# Patient Record
Sex: Female | Born: 1955 | Race: Black or African American | Hispanic: No | Smoking: Current every day smoker
Health system: Southern US, Community
[De-identification: ages and names within clinical notes are randomized; demographics above are authoritative.]

## PROBLEM LIST (undated history)

## (undated) DIAGNOSIS — R112 Nausea with vomiting, unspecified: Secondary | ICD-10-CM

## (undated) DIAGNOSIS — F17210 Nicotine dependence, cigarettes, uncomplicated: Secondary | ICD-10-CM

## (undated) DIAGNOSIS — T4145XA Adverse effect of unspecified anesthetic, initial encounter: Secondary | ICD-10-CM

## (undated) DIAGNOSIS — I209 Angina pectoris, unspecified: Secondary | ICD-10-CM

## (undated) DIAGNOSIS — R7303 Prediabetes: Secondary | ICD-10-CM

## (undated) DIAGNOSIS — K219 Gastro-esophageal reflux disease without esophagitis: Secondary | ICD-10-CM

## (undated) DIAGNOSIS — Z9889 Other specified postprocedural states: Secondary | ICD-10-CM

## (undated) DIAGNOSIS — F32A Depression, unspecified: Secondary | ICD-10-CM

## (undated) DIAGNOSIS — F419 Anxiety disorder, unspecified: Secondary | ICD-10-CM

## (undated) DIAGNOSIS — I7090 Unspecified atherosclerosis: Secondary | ICD-10-CM

## (undated) DIAGNOSIS — I739 Peripheral vascular disease, unspecified: Secondary | ICD-10-CM

## (undated) DIAGNOSIS — F329 Major depressive disorder, single episode, unspecified: Secondary | ICD-10-CM

## (undated) DIAGNOSIS — T8859XA Other complications of anesthesia, initial encounter: Secondary | ICD-10-CM

## (undated) DIAGNOSIS — M1990 Unspecified osteoarthritis, unspecified site: Secondary | ICD-10-CM

## (undated) DIAGNOSIS — D126 Benign neoplasm of colon, unspecified: Secondary | ICD-10-CM

## (undated) HISTORY — DX: Peripheral vascular disease, unspecified: I73.9

## (undated) HISTORY — DX: Unspecified osteoarthritis, unspecified site: M19.90

## (undated) HISTORY — DX: Nicotine dependence, cigarettes, uncomplicated: F17.210

## (undated) HISTORY — DX: Unspecified atherosclerosis: I70.90

## (undated) HISTORY — DX: Benign neoplasm of colon, unspecified: D12.6

## (undated) HISTORY — DX: Anxiety disorder, unspecified: F41.9

## (undated) HISTORY — DX: Prediabetes: R73.03

## (undated) HISTORY — DX: Angina pectoris, unspecified: I20.9

## (undated) HISTORY — PX: ABDOMINAL HYSTERECTOMY: SHX81

## (undated) HISTORY — PX: OTHER SURGICAL HISTORY: SHX169

---

## 1999-03-08 ENCOUNTER — Emergency Department (HOSPITAL_COMMUNITY): Admission: EM | Admit: 1999-03-08 | Discharge: 1999-03-08 | Payer: Self-pay | Admitting: Emergency Medicine

## 1999-04-17 ENCOUNTER — Other Ambulatory Visit: Admission: RE | Admit: 1999-04-17 | Discharge: 1999-04-17 | Payer: Self-pay

## 1999-04-17 ENCOUNTER — Encounter: Admission: RE | Admit: 1999-04-17 | Discharge: 1999-04-17 | Payer: Self-pay | Admitting: Internal Medicine

## 1999-06-15 ENCOUNTER — Encounter: Admission: RE | Admit: 1999-06-15 | Discharge: 1999-06-15 | Payer: Self-pay | Admitting: Internal Medicine

## 1999-06-29 DIAGNOSIS — D259 Leiomyoma of uterus, unspecified: Secondary | ICD-10-CM

## 1999-06-30 ENCOUNTER — Encounter: Payer: Self-pay | Admitting: *Deleted

## 1999-06-30 ENCOUNTER — Ambulatory Visit (HOSPITAL_COMMUNITY): Admission: RE | Admit: 1999-06-30 | Discharge: 1999-06-30 | Payer: Self-pay | Admitting: *Deleted

## 1999-07-13 ENCOUNTER — Encounter: Payer: Self-pay | Admitting: *Deleted

## 1999-07-13 ENCOUNTER — Ambulatory Visit (HOSPITAL_COMMUNITY): Admission: RE | Admit: 1999-07-13 | Discharge: 1999-07-13 | Payer: Self-pay | Admitting: *Deleted

## 1999-08-06 ENCOUNTER — Encounter: Admission: RE | Admit: 1999-08-06 | Discharge: 1999-08-06 | Payer: Self-pay | Admitting: Internal Medicine

## 1999-10-01 ENCOUNTER — Other Ambulatory Visit: Admission: RE | Admit: 1999-10-01 | Discharge: 1999-10-01 | Payer: Self-pay | Admitting: Obstetrics

## 1999-12-10 ENCOUNTER — Encounter (INDEPENDENT_AMBULATORY_CARE_PROVIDER_SITE_OTHER): Payer: Self-pay

## 1999-12-10 ENCOUNTER — Inpatient Hospital Stay (HOSPITAL_COMMUNITY): Admission: RE | Admit: 1999-12-10 | Discharge: 1999-12-13 | Payer: Self-pay | Admitting: Obstetrics

## 2006-03-11 HISTORY — PX: ABDOMINAL HYSTERECTOMY: SHX81

## 2006-04-21 ENCOUNTER — Ambulatory Visit (HOSPITAL_COMMUNITY): Admission: RE | Admit: 2006-04-21 | Discharge: 2006-04-21 | Payer: Self-pay | Admitting: Obstetrics

## 2007-06-29 DIAGNOSIS — C519 Malignant neoplasm of vulva, unspecified: Secondary | ICD-10-CM

## 2007-06-29 HISTORY — DX: Malignant neoplasm of vulva, unspecified: C51.9

## 2007-07-14 DIAGNOSIS — IMO0002 Reserved for concepts with insufficient information to code with codable children: Secondary | ICD-10-CM

## 2007-10-03 DIAGNOSIS — C4490 Unspecified malignant neoplasm of skin, unspecified: Secondary | ICD-10-CM | POA: Insufficient documentation

## 2007-10-18 ENCOUNTER — Encounter (INDEPENDENT_AMBULATORY_CARE_PROVIDER_SITE_OTHER): Payer: Self-pay | Admitting: Internal Medicine

## 2007-10-20 DIAGNOSIS — B070 Plantar wart: Secondary | ICD-10-CM | POA: Insufficient documentation

## 2007-10-31 ENCOUNTER — Ambulatory Visit: Admission: RE | Admit: 2007-10-31 | Discharge: 2007-10-31 | Payer: Self-pay | Admitting: Gynecologic Oncology

## 2007-11-02 ENCOUNTER — Encounter (INDEPENDENT_AMBULATORY_CARE_PROVIDER_SITE_OTHER): Payer: Self-pay | Admitting: Internal Medicine

## 2007-11-07 DIAGNOSIS — R0989 Other specified symptoms and signs involving the circulatory and respiratory systems: Secondary | ICD-10-CM | POA: Insufficient documentation

## 2007-11-07 DIAGNOSIS — R079 Chest pain, unspecified: Secondary | ICD-10-CM | POA: Insufficient documentation

## 2007-11-07 DIAGNOSIS — F17200 Nicotine dependence, unspecified, uncomplicated: Secondary | ICD-10-CM | POA: Insufficient documentation

## 2007-11-07 DIAGNOSIS — R0609 Other forms of dyspnea: Secondary | ICD-10-CM | POA: Insufficient documentation

## 2007-11-10 ENCOUNTER — Telehealth (INDEPENDENT_AMBULATORY_CARE_PROVIDER_SITE_OTHER): Payer: Self-pay | Admitting: *Deleted

## 2007-11-14 ENCOUNTER — Ambulatory Visit: Payer: Self-pay | Admitting: Internal Medicine

## 2007-11-14 DIAGNOSIS — E049 Nontoxic goiter, unspecified: Secondary | ICD-10-CM | POA: Insufficient documentation

## 2007-11-15 ENCOUNTER — Encounter (INDEPENDENT_AMBULATORY_CARE_PROVIDER_SITE_OTHER): Payer: Self-pay | Admitting: Internal Medicine

## 2007-11-16 ENCOUNTER — Encounter (INDEPENDENT_AMBULATORY_CARE_PROVIDER_SITE_OTHER): Payer: Self-pay | Admitting: Internal Medicine

## 2007-11-22 ENCOUNTER — Ambulatory Visit (HOSPITAL_COMMUNITY): Admission: RE | Admit: 2007-11-22 | Discharge: 2007-11-22 | Payer: Self-pay | Admitting: Internal Medicine

## 2007-11-23 ENCOUNTER — Ambulatory Visit: Payer: Self-pay | Admitting: Internal Medicine

## 2007-11-23 ENCOUNTER — Telehealth (INDEPENDENT_AMBULATORY_CARE_PROVIDER_SITE_OTHER): Payer: Self-pay | Admitting: Internal Medicine

## 2007-11-23 LAB — CONVERTED CEMR LAB
AST: 15 units/L (ref 0–37)
Albumin: 4.4 g/dL (ref 3.5–5.2)
Alkaline Phosphatase: 76 units/L (ref 39–117)
BUN: 11 mg/dL (ref 6–23)
Basophils Relative: 0 % (ref 0–1)
Creatinine, Ser: 0.65 mg/dL (ref 0.40–1.20)
Eosinophils Absolute: 0.1 10*3/uL (ref 0.0–0.7)
Eosinophils Relative: 1 % (ref 0–5)
Glucose, Bld: 84 mg/dL (ref 70–99)
HCT: 42.6 % (ref 36.0–46.0)
HDL: 51 mg/dL (ref >39)
Hemoglobin: 13.6 g/dL (ref 12.0–15.0)
LDL Cholesterol: 136 mg/dL — ABNORMAL HIGH (ref 0–99)
Lymphs Abs: 3.3 10*3/uL (ref 0.7–4.0)
MCHC: 31.9 g/dL (ref 30.0–36.0)
MCV: 101.2 fL — ABNORMAL HIGH (ref 78.0–100.0)
Monocytes Absolute: 0.8 10*3/uL (ref 0.1–1.0)
Monocytes Relative: 10 % (ref 3–12)
Neutrophils Relative %: 47 % (ref 43–77)
Nitrite: NEGATIVE
Potassium: 4 meq/L (ref 3.5–5.3)
RBC: 4.21 M/uL (ref 3.87–5.11)
Total CHOL/HDL Ratio: 4.4
Triglycerides: 174 mg/dL — ABNORMAL HIGH (ref <150)
Urobilinogen, UA: 0.2
WBC: 7.9 10*3/uL (ref 4.0–10.5)
pH: 5

## 2007-11-27 ENCOUNTER — Ambulatory Visit: Payer: Self-pay | Admitting: Cardiovascular Disease

## 2007-11-27 ENCOUNTER — Encounter (INDEPENDENT_AMBULATORY_CARE_PROVIDER_SITE_OTHER): Payer: Self-pay | Admitting: *Deleted

## 2007-12-07 ENCOUNTER — Ambulatory Visit: Payer: Self-pay

## 2007-12-07 ENCOUNTER — Encounter (INDEPENDENT_AMBULATORY_CARE_PROVIDER_SITE_OTHER): Payer: Self-pay | Admitting: Internal Medicine

## 2007-12-20 ENCOUNTER — Telehealth (INDEPENDENT_AMBULATORY_CARE_PROVIDER_SITE_OTHER): Payer: Self-pay | Admitting: Internal Medicine

## 2007-12-27 HISTORY — PX: RADICAL VULVECTOMY: SHX2286

## 2008-01-02 ENCOUNTER — Encounter: Payer: Self-pay | Admitting: Gynecologic Oncology

## 2008-01-02 ENCOUNTER — Encounter (INDEPENDENT_AMBULATORY_CARE_PROVIDER_SITE_OTHER): Payer: Self-pay | Admitting: Internal Medicine

## 2008-01-02 ENCOUNTER — Ambulatory Visit (HOSPITAL_COMMUNITY): Admission: RE | Admit: 2008-01-02 | Discharge: 2008-01-03 | Payer: Self-pay | Admitting: Gynecologic Oncology

## 2008-01-16 ENCOUNTER — Ambulatory Visit: Admission: RE | Admit: 2008-01-16 | Discharge: 2008-01-16 | Payer: Self-pay | Admitting: Gynecologic Oncology

## 2008-01-30 ENCOUNTER — Ambulatory Visit: Admission: RE | Admit: 2008-01-30 | Discharge: 2008-01-30 | Payer: Self-pay | Admitting: Gynecologic Oncology

## 2008-01-30 ENCOUNTER — Encounter (INDEPENDENT_AMBULATORY_CARE_PROVIDER_SITE_OTHER): Payer: Self-pay | Admitting: Internal Medicine

## 2008-02-13 ENCOUNTER — Ambulatory Visit: Admission: RE | Admit: 2008-02-13 | Discharge: 2008-02-13 | Payer: Self-pay | Admitting: Gynecologic Oncology

## 2008-02-13 ENCOUNTER — Encounter (INDEPENDENT_AMBULATORY_CARE_PROVIDER_SITE_OTHER): Payer: Self-pay | Admitting: Internal Medicine

## 2008-03-11 ENCOUNTER — Encounter (INDEPENDENT_AMBULATORY_CARE_PROVIDER_SITE_OTHER): Payer: Self-pay | Admitting: Internal Medicine

## 2008-03-12 ENCOUNTER — Ambulatory Visit: Payer: Self-pay | Admitting: Internal Medicine

## 2008-03-12 DIAGNOSIS — M25579 Pain in unspecified ankle and joints of unspecified foot: Secondary | ICD-10-CM

## 2008-03-12 DIAGNOSIS — M25569 Pain in unspecified knee: Secondary | ICD-10-CM

## 2008-03-18 ENCOUNTER — Encounter (INDEPENDENT_AMBULATORY_CARE_PROVIDER_SITE_OTHER): Payer: Self-pay | Admitting: Internal Medicine

## 2008-03-19 ENCOUNTER — Ambulatory Visit: Admission: RE | Admit: 2008-03-19 | Discharge: 2008-03-19 | Payer: Self-pay | Admitting: Gynecologic Oncology

## 2008-03-19 ENCOUNTER — Encounter (INDEPENDENT_AMBULATORY_CARE_PROVIDER_SITE_OTHER): Payer: Self-pay | Admitting: Internal Medicine

## 2008-03-20 ENCOUNTER — Encounter (INDEPENDENT_AMBULATORY_CARE_PROVIDER_SITE_OTHER): Payer: Self-pay | Admitting: Internal Medicine

## 2008-03-21 ENCOUNTER — Encounter (INDEPENDENT_AMBULATORY_CARE_PROVIDER_SITE_OTHER): Payer: Self-pay | Admitting: Internal Medicine

## 2008-04-11 ENCOUNTER — Encounter (INDEPENDENT_AMBULATORY_CARE_PROVIDER_SITE_OTHER): Payer: Self-pay | Admitting: Internal Medicine

## 2008-06-12 ENCOUNTER — Ambulatory Visit: Admission: RE | Admit: 2008-06-12 | Discharge: 2008-06-12 | Payer: Self-pay | Admitting: Gynecologic Oncology

## 2008-06-12 ENCOUNTER — Encounter (INDEPENDENT_AMBULATORY_CARE_PROVIDER_SITE_OTHER): Payer: Self-pay | Admitting: Internal Medicine

## 2008-08-13 ENCOUNTER — Ambulatory Visit: Payer: Self-pay | Admitting: Internal Medicine

## 2008-08-13 DIAGNOSIS — M76899 Other specified enthesopathies of unspecified lower limb, excluding foot: Secondary | ICD-10-CM

## 2008-08-15 ENCOUNTER — Encounter (INDEPENDENT_AMBULATORY_CARE_PROVIDER_SITE_OTHER): Payer: Self-pay | Admitting: Internal Medicine

## 2008-08-16 ENCOUNTER — Encounter (INDEPENDENT_AMBULATORY_CARE_PROVIDER_SITE_OTHER): Payer: Self-pay | Admitting: Internal Medicine

## 2008-08-16 DIAGNOSIS — M79609 Pain in unspecified limb: Secondary | ICD-10-CM

## 2008-08-21 ENCOUNTER — Encounter (INDEPENDENT_AMBULATORY_CARE_PROVIDER_SITE_OTHER): Payer: Self-pay | Admitting: Internal Medicine

## 2008-09-25 ENCOUNTER — Encounter (INDEPENDENT_AMBULATORY_CARE_PROVIDER_SITE_OTHER): Payer: Self-pay | Admitting: Internal Medicine

## 2008-09-25 ENCOUNTER — Ambulatory Visit: Admission: RE | Admit: 2008-09-25 | Discharge: 2008-09-25 | Payer: Self-pay | Admitting: Gynecologic Oncology

## 2008-09-25 ENCOUNTER — Encounter (INDEPENDENT_AMBULATORY_CARE_PROVIDER_SITE_OTHER): Payer: Self-pay | Admitting: Gynecologic Oncology

## 2008-10-09 ENCOUNTER — Telehealth (INDEPENDENT_AMBULATORY_CARE_PROVIDER_SITE_OTHER): Payer: Self-pay | Admitting: Internal Medicine

## 2008-10-14 ENCOUNTER — Encounter (INDEPENDENT_AMBULATORY_CARE_PROVIDER_SITE_OTHER): Payer: Self-pay | Admitting: Internal Medicine

## 2008-10-16 ENCOUNTER — Encounter (INDEPENDENT_AMBULATORY_CARE_PROVIDER_SITE_OTHER): Payer: Self-pay | Admitting: Internal Medicine

## 2008-10-22 ENCOUNTER — Encounter: Payer: Self-pay | Admitting: Gynecologic Oncology

## 2008-10-22 ENCOUNTER — Ambulatory Visit (HOSPITAL_COMMUNITY): Admission: RE | Admit: 2008-10-22 | Discharge: 2008-10-22 | Payer: Self-pay | Admitting: Gynecologic Oncology

## 2008-10-24 ENCOUNTER — Ambulatory Visit: Payer: Self-pay | Admitting: Internal Medicine

## 2008-11-13 ENCOUNTER — Encounter: Payer: Self-pay | Admitting: Cardiovascular Disease

## 2008-11-13 ENCOUNTER — Ambulatory Visit: Admission: RE | Admit: 2008-11-13 | Discharge: 2008-11-13 | Payer: Self-pay | Admitting: Gynecologic Oncology

## 2008-12-03 ENCOUNTER — Encounter: Payer: Self-pay | Admitting: Gynecologic Oncology

## 2008-12-03 ENCOUNTER — Ambulatory Visit (HOSPITAL_COMMUNITY): Admission: RE | Admit: 2008-12-03 | Discharge: 2008-12-04 | Payer: Self-pay | Admitting: Gynecologic Oncology

## 2008-12-05 ENCOUNTER — Telehealth (INDEPENDENT_AMBULATORY_CARE_PROVIDER_SITE_OTHER): Payer: Self-pay | Admitting: Internal Medicine

## 2008-12-17 ENCOUNTER — Encounter: Payer: Self-pay | Admitting: Cardiovascular Disease

## 2008-12-17 ENCOUNTER — Ambulatory Visit: Admission: RE | Admit: 2008-12-17 | Discharge: 2008-12-17 | Payer: Self-pay | Admitting: Gynecologic Oncology

## 2008-12-25 ENCOUNTER — Encounter (INDEPENDENT_AMBULATORY_CARE_PROVIDER_SITE_OTHER): Payer: Self-pay | Admitting: *Deleted

## 2009-01-08 ENCOUNTER — Encounter: Payer: Self-pay | Admitting: Cardiovascular Disease

## 2009-01-08 ENCOUNTER — Ambulatory Visit: Admission: RE | Admit: 2009-01-08 | Discharge: 2009-01-08 | Payer: Self-pay | Admitting: Gynecologic Oncology

## 2009-04-02 ENCOUNTER — Ambulatory Visit: Admission: RE | Admit: 2009-04-02 | Discharge: 2009-04-02 | Payer: Self-pay | Admitting: Gynecology

## 2009-04-02 ENCOUNTER — Encounter: Payer: Self-pay | Admitting: Cardiovascular Disease

## 2009-05-09 ENCOUNTER — Ambulatory Visit: Payer: Self-pay | Admitting: Internal Medicine

## 2009-05-12 ENCOUNTER — Telehealth (INDEPENDENT_AMBULATORY_CARE_PROVIDER_SITE_OTHER): Payer: Self-pay | Admitting: Internal Medicine

## 2009-05-16 ENCOUNTER — Emergency Department (HOSPITAL_COMMUNITY): Admission: EM | Admit: 2009-05-16 | Discharge: 2009-05-16 | Payer: Self-pay | Admitting: Family Medicine

## 2009-05-28 ENCOUNTER — Ambulatory Visit: Payer: Self-pay | Admitting: Internal Medicine

## 2009-05-28 DIAGNOSIS — M542 Cervicalgia: Secondary | ICD-10-CM

## 2009-05-28 DIAGNOSIS — M25519 Pain in unspecified shoulder: Secondary | ICD-10-CM

## 2009-05-28 LAB — CONVERTED CEMR LAB
BUN: 11 mg/dL (ref 6–23)
CO2: 22 meq/L (ref 19–32)
Glucose, Bld: 82 mg/dL (ref 70–99)
Potassium: 4.1 meq/L (ref 3.5–5.3)
Sodium: 142 meq/L (ref 135–145)

## 2009-06-06 ENCOUNTER — Encounter (INDEPENDENT_AMBULATORY_CARE_PROVIDER_SITE_OTHER): Payer: Self-pay | Admitting: Internal Medicine

## 2009-06-06 ENCOUNTER — Ambulatory Visit (HOSPITAL_COMMUNITY): Admission: RE | Admit: 2009-06-06 | Discharge: 2009-06-06 | Payer: Self-pay | Admitting: Internal Medicine

## 2009-06-16 ENCOUNTER — Encounter (INDEPENDENT_AMBULATORY_CARE_PROVIDER_SITE_OTHER): Payer: Self-pay | Admitting: Internal Medicine

## 2009-07-01 ENCOUNTER — Telehealth (INDEPENDENT_AMBULATORY_CARE_PROVIDER_SITE_OTHER): Payer: Self-pay | Admitting: Internal Medicine

## 2009-07-14 ENCOUNTER — Encounter: Admission: RE | Admit: 2009-07-14 | Discharge: 2009-08-13 | Payer: Self-pay | Admitting: Internal Medicine

## 2009-07-21 ENCOUNTER — Encounter (INDEPENDENT_AMBULATORY_CARE_PROVIDER_SITE_OTHER): Payer: Self-pay | Admitting: Internal Medicine

## 2009-09-12 ENCOUNTER — Ambulatory Visit: Payer: Self-pay | Admitting: Internal Medicine

## 2009-09-12 DIAGNOSIS — M5412 Radiculopathy, cervical region: Secondary | ICD-10-CM | POA: Insufficient documentation

## 2009-09-16 ENCOUNTER — Encounter (INDEPENDENT_AMBULATORY_CARE_PROVIDER_SITE_OTHER): Payer: Self-pay | Admitting: Internal Medicine

## 2009-09-17 ENCOUNTER — Encounter: Admission: RE | Admit: 2009-09-17 | Discharge: 2009-09-17 | Payer: Self-pay | Admitting: Internal Medicine

## 2010-02-23 ENCOUNTER — Ambulatory Visit: Payer: Self-pay | Admitting: Internal Medicine

## 2010-02-23 DIAGNOSIS — M545 Low back pain, unspecified: Secondary | ICD-10-CM | POA: Insufficient documentation

## 2010-02-23 DIAGNOSIS — N3946 Mixed incontinence: Secondary | ICD-10-CM | POA: Insufficient documentation

## 2010-02-24 ENCOUNTER — Encounter (INDEPENDENT_AMBULATORY_CARE_PROVIDER_SITE_OTHER): Payer: Self-pay | Admitting: Internal Medicine

## 2010-02-25 ENCOUNTER — Telehealth (INDEPENDENT_AMBULATORY_CARE_PROVIDER_SITE_OTHER): Payer: Self-pay | Admitting: *Deleted

## 2010-03-06 ENCOUNTER — Encounter (INDEPENDENT_AMBULATORY_CARE_PROVIDER_SITE_OTHER): Payer: Self-pay | Admitting: Internal Medicine

## 2010-03-06 DIAGNOSIS — E7800 Pure hypercholesterolemia, unspecified: Secondary | ICD-10-CM | POA: Insufficient documentation

## 2010-03-06 LAB — CONVERTED CEMR LAB
ALT: <8 units/L (ref 0–35)
AST: 13 units/L (ref 0–37)
Alkaline Phosphatase: 75 units/L (ref 39–117)
Basophils Absolute: 0 10*3/uL (ref 0.0–0.1)
Basophils Relative: 0 % (ref 0–1)
Eosinophils Absolute: 0.1 10*3/uL (ref 0.0–0.7)
Eosinophils Relative: 1 % (ref 0–5)
Glucose, Bld: 84 mg/dL (ref 70–99)
HCT: 42.2 % (ref 36.0–46.0)
LDL Cholesterol: 143 mg/dL — ABNORMAL HIGH (ref 0–99)
MCHC: 31.8 g/dL (ref 30.0–36.0)
MCV: 99.8 fL (ref 78.0–100.0)
Neutrophils Relative %: 42 % — ABNORMAL LOW (ref 43–77)
Platelets: 342 10*3/uL (ref 150–400)
RDW: 15.2 % (ref 11.5–15.5)
Sodium: 145 meq/L (ref 135–145)
Total Bilirubin: 0.5 mg/dL (ref 0.3–1.2)
Total Protein: 7.4 g/dL (ref 6.0–8.3)
Triglycerides: 118 mg/dL (ref <150)
VLDL: 24 mg/dL (ref 0–40)

## 2010-03-11 ENCOUNTER — Telehealth (INDEPENDENT_AMBULATORY_CARE_PROVIDER_SITE_OTHER): Payer: Self-pay | Admitting: Internal Medicine

## 2010-03-26 ENCOUNTER — Ambulatory Visit: Admission: RE | Admit: 2010-03-26 | Discharge: 2010-03-26 | Payer: Self-pay | Admitting: Gynecologic Oncology

## 2010-04-24 IMAGING — CR DG CHEST 2V
2 series · 2 of 2 positions shown · non-contrast
Comparison: None

CLINICAL DATA: Chest pain, shortness of breath, smoker

CHEST - 2 VIEW

[w chest pa]
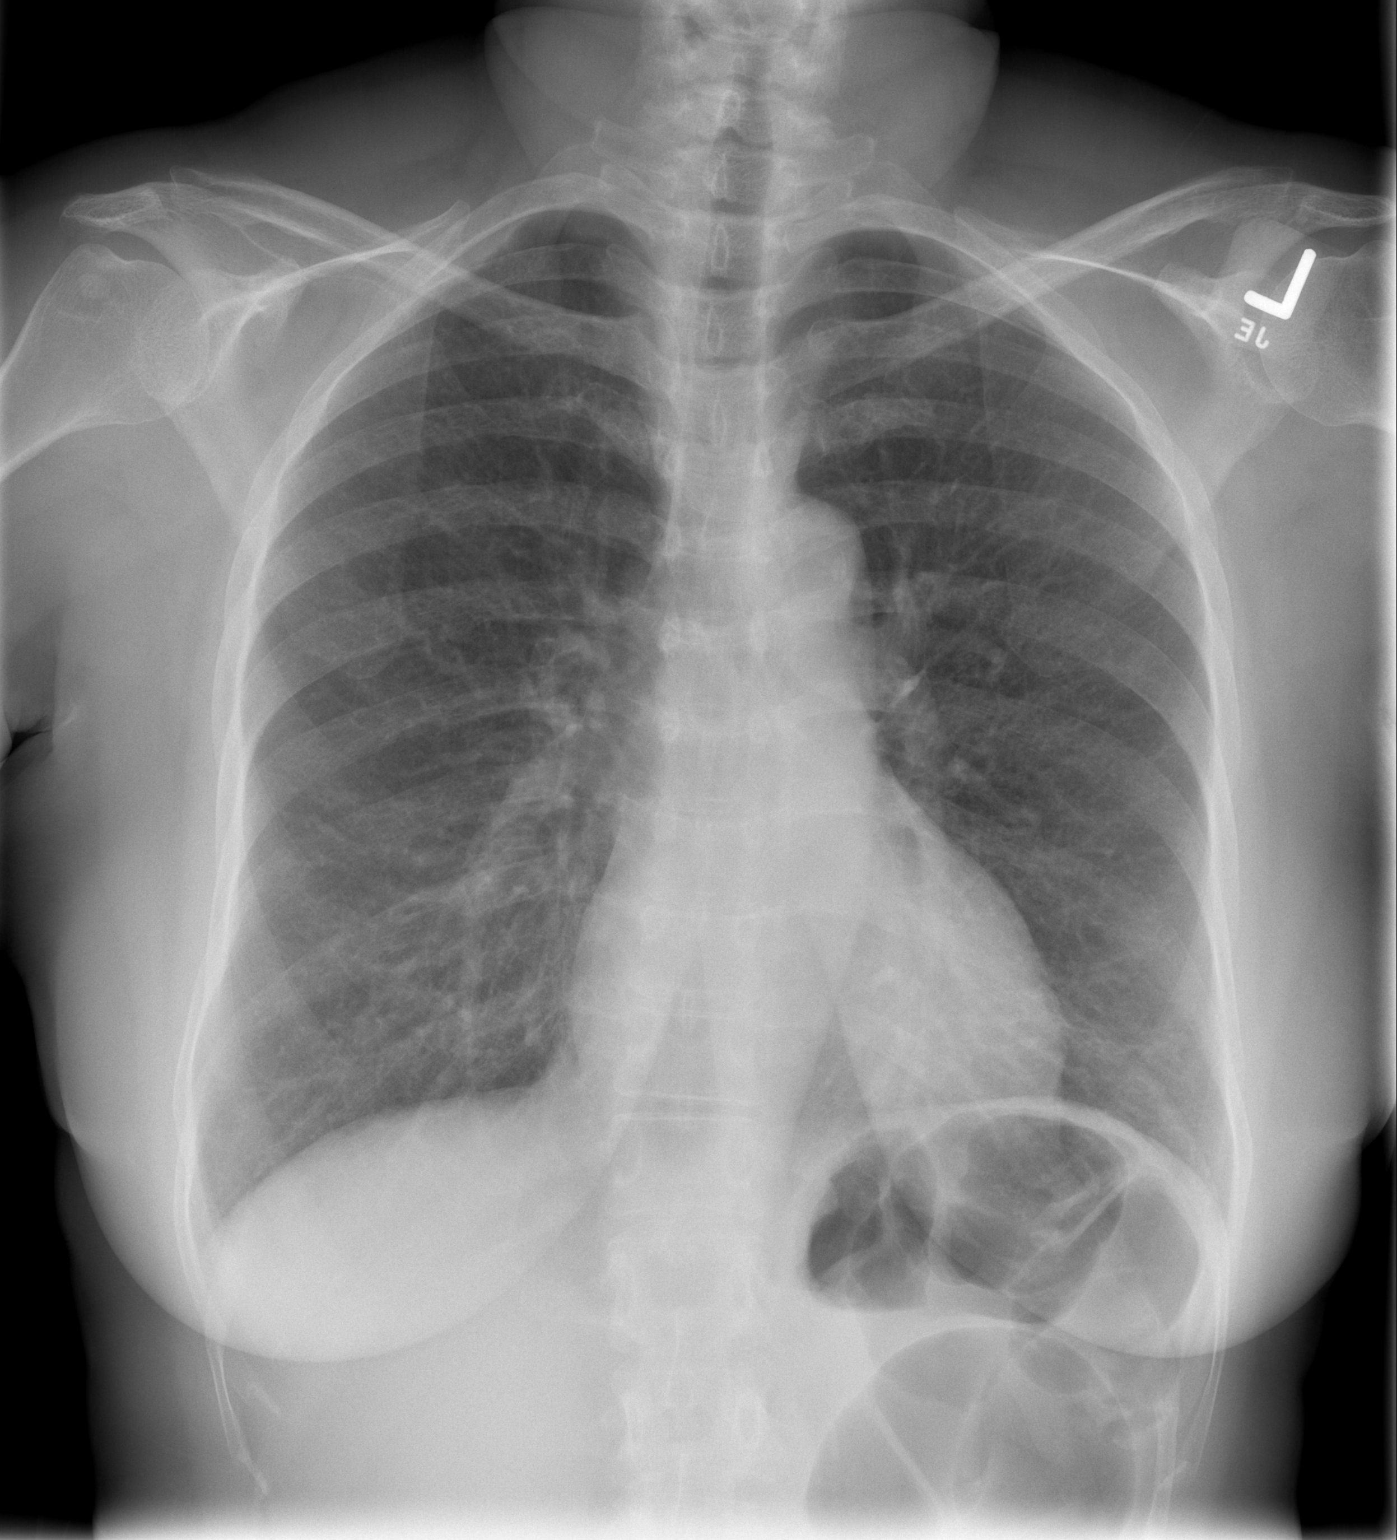

[w chest lat *]
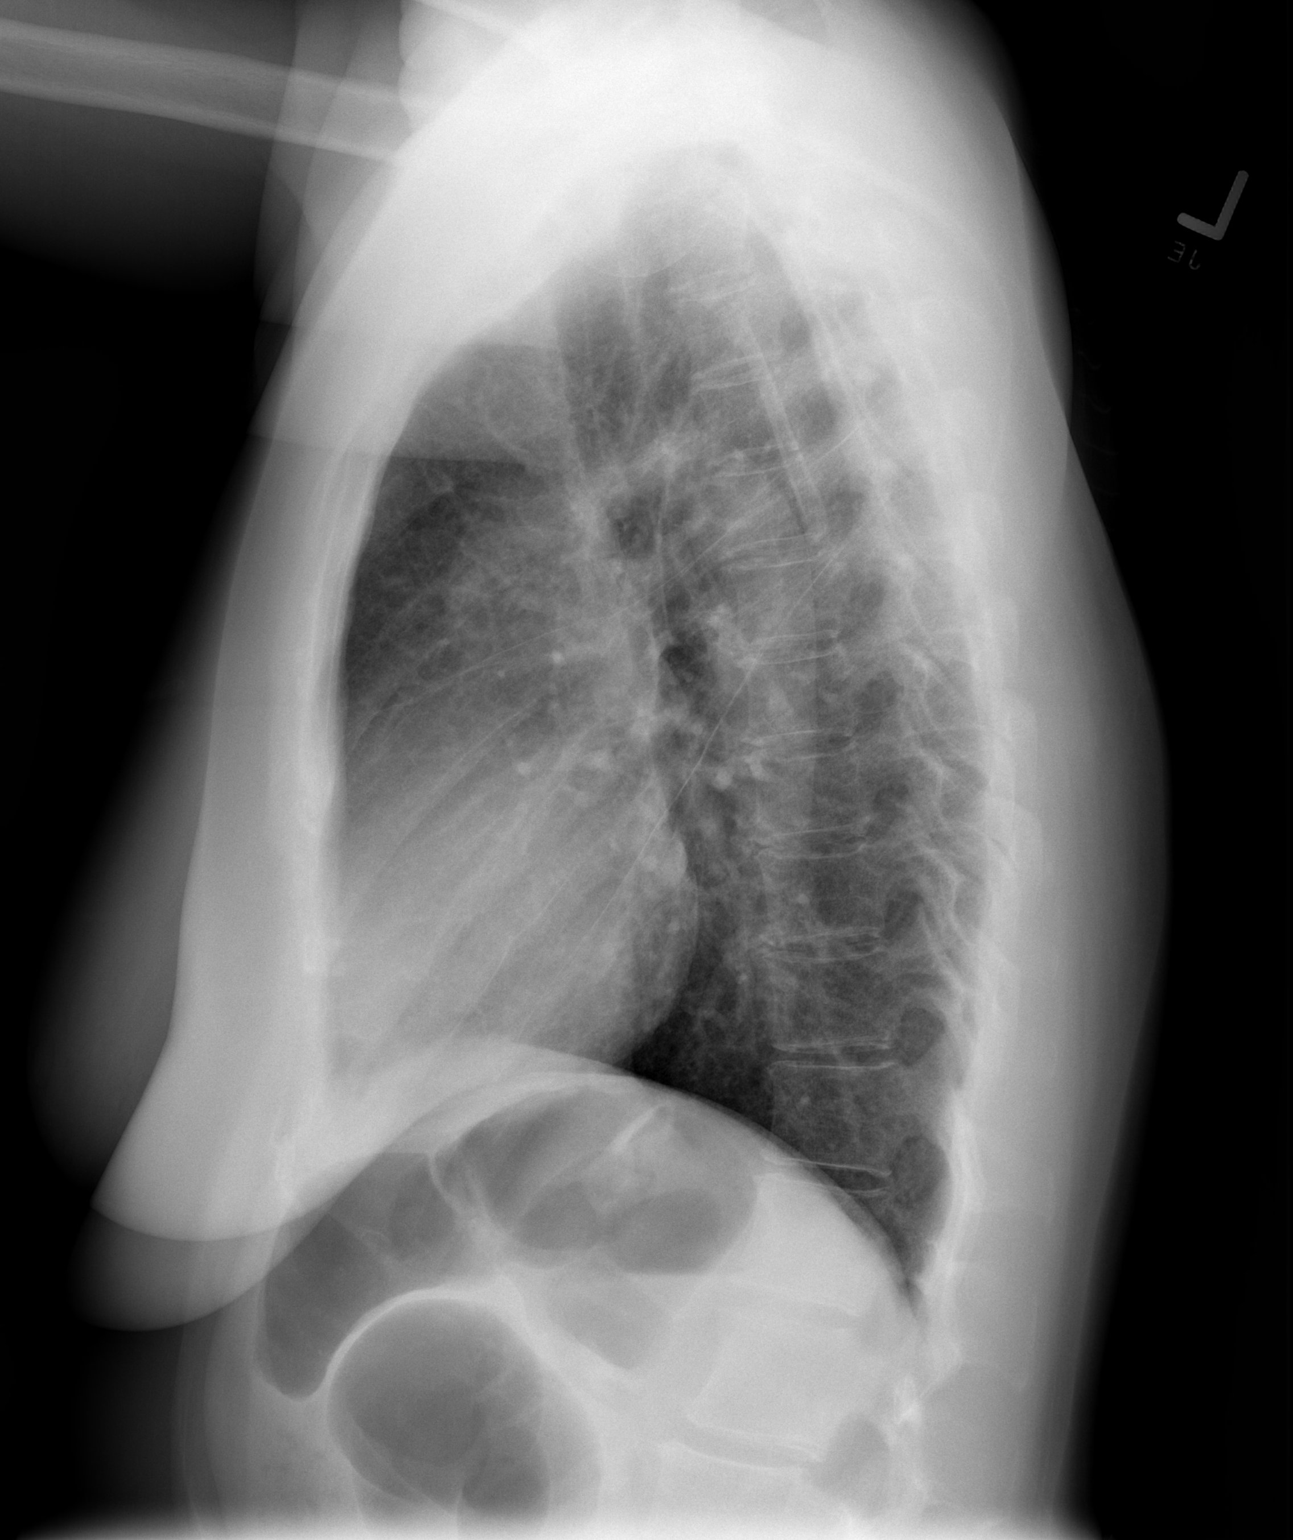

[2 of 2 positions shown; findings below may reference images not displayed]

FINDINGS: Heart size is normal.  Increased interstitial lung
markings are noted without focal pulmonary opacity.  No pleural
effusion.  No acute osseous abnormality.
IMPRESSION: No acute cardiopulmonary process.

## 2010-04-27 ENCOUNTER — Ambulatory Visit: Payer: Self-pay | Admitting: Internal Medicine

## 2010-06-05 ENCOUNTER — Ambulatory Visit: Payer: Self-pay | Admitting: Internal Medicine

## 2010-06-09 ENCOUNTER — Ambulatory Visit (HOSPITAL_COMMUNITY)
Admission: RE | Admit: 2010-06-09 | Discharge: 2010-06-09 | Payer: Self-pay | Source: Home / Self Care | Attending: Internal Medicine | Admitting: Internal Medicine

## 2010-06-16 ENCOUNTER — Encounter (INDEPENDENT_AMBULATORY_CARE_PROVIDER_SITE_OTHER): Payer: Self-pay | Admitting: Internal Medicine

## 2010-07-19 ENCOUNTER — Encounter: Payer: Self-pay | Admitting: Physical Medicine and Rehabilitation

## 2010-07-19 ENCOUNTER — Encounter: Payer: Self-pay | Admitting: Internal Medicine

## 2010-07-20 ENCOUNTER — Encounter: Payer: Self-pay | Admitting: Physical Medicine and Rehabilitation

## 2010-07-27 ENCOUNTER — Encounter
Admission: RE | Admit: 2010-07-27 | Discharge: 2010-07-28 | Payer: Self-pay | Source: Home / Self Care | Attending: Internal Medicine | Admitting: Internal Medicine

## 2010-07-30 NOTE — Letter (Signed)
Summary: *HSN Results Follow up  [facility]  [address] [place], [place] [address]   Phone: [phone]  Fax: [phone]      06/16/2010   [name] [address] [place], [place]  [address]   Dear  Ms. [name],                            ____S.Drinkard,FNP   ____D. Gore,FNP       ____B. McPherson,MD   ____V. Rankins,MD    __X__E. Mulberry,MD    ____N. Daphine Deutscher, FNP  ____D. Reche Dixon, MD    ____K. Philipp Deputy, MD    ____Other     This letter is to inform you that your recent test(s):  _______Pap Smear    _______Lab Test     ___X____X-ray    ____X___ is within acceptable limits  _______ requires a medication change  _______ requires a follow-up lab visit  _______ requires a follow-up visit with your provider   Comments:  Call if your pain does not improve.  No abnormal findings on xray of leg.       _________________________________________________________ If you have any questions, please contact our office                     Sincerely,  Julieanne Manson MD [facility]

## 2010-07-30 NOTE — Letter (Signed)
Summary: TEST ORDER FORM/MRI  TEST ORDER FORM/MRI   Imported By: Arta Bruce 03/12/2010 14:21:19  _____________________________________________________________________  External Attachment:    Type:   Image     Comment:   External Document

## 2010-07-30 NOTE — Progress Notes (Signed)
Summary: REFILL ON PAIN MED  Phone Note Call from Patient Call back at Home Phone [phone]   Summary of Call: MULBERRY PT. MS [place] CALLED BECAUSE SHE WAS TO LET DR MULBERRY KNOW IF SHE NEEDED DICLOFENAC SODIUM 75MG  AND SHE DOES. SHE USES [facility]. Initial call taken by: Leodis Rains,  March 11, 2010 3:15 PM  Follow-up for Phone Call        fwd to provider Follow-up by: Michelle Nasuti,  March 16, 2010 10:11 AM  Additional Follow-up for Phone Call Additional follow up Details #1::        Let her know I sent to [facility] not need to see this back  Additional Follow-up by: Julieanne Manson MD,  March 19, 2010 6:18 PM    Additional Follow-up for Phone Call Additional follow up Details #2::    no answer unable to leave msg Michelle Nasuti  March 20, 2010 8:49 AM  pt aware Michelle Nasuti  March 23, 2010 8:41 AM   Prescriptions: DICLOFENAC SODIUM 75 MG TBEC (DICLOFENAC SODIUM) 1 tab by mouth two times a day with food.  #60 x 2   Entered and Authorized by:   Julieanne Manson MD   Signed by:   Julieanne Manson MD on 03/19/2010   Method used:   Electronically to        [facility] # [phone]* (retail)       [address]       [place], [place]  [address]       Ph: [phone]       Fax: [phone]   RxID:   [id number]   Appended Document: REFILL ON PAIN MED at pts request rx called into [facility] pharmacy

## 2010-07-30 NOTE — Letter (Signed)
Summary: MAILED UPDATED RECORDS TO [facility]  MAILED UPDATED RECORDS TO [facility]   Imported By: Arta Bruce 03/06/2010 11:37:45  _____________________________________________________________________  External Attachment:    Type:   Image     Comment:   External Document

## 2010-07-30 NOTE — Progress Notes (Signed)
Summary: GYN ONCOLOGY APPT  Phone Note Outgoing Call   Summary of Call: I SPOKE TO MS Filter REGARDING HER APPT GYN ONOLOGY  [facility]  03-26-10 @ 1:30PM ARRIVAL @ 1PM  PT IS AWARE OF HER APPT . Initial call taken by: Cheryll Dessert,  February 25, 2010 3:53 PM

## 2010-07-30 NOTE — Assessment & Plan Note (Signed)
Summary: RIGHT ARM PAIN/ GK   Vital Signs:  Patient profile:   55 year old female Weight:      157.9 pounds Temp:     98.3 degrees F Pulse rate:   89 / minute Pulse rhythm:   regular Resp:     18 per minute BP sitting:   160 / 89  (left arm) Cuff size:   regular  Vitals Entered By: Vesta Mixer CMA (September 12, 2009 11:25 AM) CC: right shoulder/arm pain that started yesterday.  Sharp pain then fingers feel a little numb. Is Patient Diabetic? No Pain Assessment Patient in pain? yes     Location: rt arm Intensity: 6  Does patient need assistance? Ambulation Normal   CC:  right shoulder/arm pain that started yesterday.  Sharp pain then fingers feel a little numb.Marland Kitchen  History of Present Illness: 1.  Right neck, shoulder and arm pain continues.  Numbness and tingling shooting into thumb, index, middle and ring fingers.  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  Finally got into PT in January.  Pt. states she went for a couple of weeks, 3 times weekly--pt. states she was discharged at that point--all the time she was given.  Home exercises--doing 3 times weekly.  Pain seems just as bad as before.  Allergies (verified): 1)  Codeine  Physical Exam  Msk:  Right shoulder and neck:  Difficulty abducting and flexing shoulder above 90 degrees secondary to pain.  Almost full passive abduction and flexion.  Pain and decreased ROM with internal rotation and external rotation. Tender over cervical spinous processes, though pt. jumping in pain before actual contact made at times.  Tender along both heads of trap. Tender over AC, CC joints and subacromial bursa area.  Tender over biceps tendon.   Neurologic:  strength normal in upper extremities and DTRs symmetrical and normal.     Impression & Recommendations:  Problem # 1:  CERVICAL RADICULOPATHY, RIGHT (ICD-723.4)  Vs more of a shoulder abnormality Diclofenac--pt never picked up--sent a new Rx  Orders: MRI without  Contrast (MRI w/o Contrast)  Complete Medication List: 1)  Estradiol 0.0375 Mg/24hr Ptwk (Estradiol) .... Apply once a week 2)  Aspirin Ec 81 Mg Tbec (Aspirin) .Marland Kitchen.. 1 tab by mouth daily 3)  Cymbalta 30 Mg Cpep (Duloxetine hcl) .... 2 by mouth at bedtime 4)  Aldara 5 % Crea (Imiquimod) .... Appy three times a week 5)  Diclofenac Sodium 75 Mg Tbec (Diclofenac sodium) .Marland Kitchen.. 1 tab by mouth two times a day with food. 6)  Cyclobenzaprine Hcl 10 Mg Tabs (Cyclobenzaprine hcl) .Marland Kitchen.. 1 tab by mouth every 8 hours as needed for neck and shoulder pain 7)  Ativan 1 Mg Tabs (Lorazepam) .... 1/2 to 1 tab by mouth 1/2 hour before mri--make sure you have a ride  Patient Instructions: 1)  Follow up with Dr. Delrae Alfred in 3 months --neck pain Prescriptions: ATIVAN 1 MG TABS (LORAZEPAM) 1/2 to 1 tab by mouth 1/2 hour before MRI--make sure you have a ride  #1 x 0   Entered and Authorized by:   Julieanne Manson MD   Signed by:   Julieanne Manson MD on 09/12/2009   Method used:   Print then Give to Patient   RxID:   [id number] CYCLOBENZAPRINE HCL 10 MG TABS (CYCLOBENZAPRINE HCL) 1 tab by mouth every 8 hours as needed for neck and shoulder pain  #20 x 0   Entered and Authorized by:   Lanora Manis  Jhony Antrim MD   Signed by:   Julieanne Manson MD on 09/12/2009   Method used:   Print then Give to Patient   RxID:   [id number] CYCLOBENZAPRINE HCL 10 MG TABS (CYCLOBENZAPRINE HCL) 1 tab by mouth every 8 hours as needed for neck and shoulder pain  #20 x 0   Entered and Authorized by:   Julieanne Manson MD   Signed by:   Julieanne Manson MD on 09/12/2009   Method used:   Print then Give to Patient   RxID:   [id number] DICLOFENAC SODIUM 75 MG TBEC (DICLOFENAC SODIUM) 1 tab by mouth two times a day with food.  #60 x 2   Entered and Authorized by:   Julieanne Manson MD   Signed by:   Julieanne Manson MD on 09/12/2009   Method used:   Electronically to        [facility] # [phone]*  (retail)       [address]       [place], [place]  [address]       Ph: [phone]       Fax: [phone]   RxID:   [id number]

## 2010-07-30 NOTE — Progress Notes (Signed)
Summary: DIDNT HEAR FROM REHAB/WANTS TO STOP SMOKING  Phone Note Call from Patient Call back at [facility] Phone [phone]   Summary of Call: [name] PT. [name] CALLED TO LET YOU KNOW THAT SHE DID NOT HEAR FROM THE [facility] FOR AN APPT. AND ALSO SHE WANTS TO STOP SMOKING AND WANTS TO TRY NICOTROL-INHALER. SHE USES [facility] [address]. Initial call taken by: Leodis Rains,  July 01, 2009 12:21 PM  Follow-up for Phone Call        Will refax PT referral and forward note to Dr. Delrae Alfred Follow-up by: Vesta Mixer CMA,  July 01, 2009 12:47 PM  Additional Follow-up for Phone Call Additional follow up Details #1::        Thanks for refaxing--remind pt. to notify if again no response. I do not believe nicotrol inhaler is covered by anybody and actually do not believe it works that well.  What else has she tried before to quit? Additional Follow-up by: Julieanne Manson MD,  July 03, 2009 5:48 PM    Additional Follow-up for Phone Call Additional follow up Details #2::    pt states she has heard from [facility] they have scheduled her an appt and they will go from there, she couldnt remember the appt date and time off hand.  Pt states she has tried the nicotine gum before, and chantix.  She states that when she was on Chantix it gave her a bad feelingmade her very nervous and depressed.  She states while taking the chantix she was still smoking whie on it.[name]   July 09, 2009 4:18 PM  Have her make an appt. to discuss Wellbutrin.  Julieanne Manson MD  July 09, 2009 9:57 PM   Additional Follow-up for Phone Call Additional follow up Details #3:: Details for Additional Follow-up Action Taken: Left message on answering machine for pt to return call ........... [name] CMA  July 14, 2009 11:26 AM  Pt is scheduled for f/u 08/26/09 Additional Follow-up by: Vesta Mixer CMA,  July 21, 2009 12:09 PM

## 2010-07-30 NOTE — Letter (Signed)
Summary: PODIATRY  PODIATRY   Imported By: Arta Bruce 05/13/2010 14:26:29  _____________________________________________________________________  External Attachment:    Type:   Image     Comment:   External Document

## 2010-07-30 NOTE — Assessment & Plan Note (Signed)
Summary: HIP/BACK/SIDE PAIN/HOT FLASHES//KT   Vital Signs:  Patient profile:   55 year old female Height:      65 inches Weight:      150 pounds BMI:     25.05 Temp:     97.9 degrees F oral Pulse rate:   66 / minute Pulse rhythm:   regular Resp:     18 per minute BP sitting:   122 / 75  (left arm)  Vitals Entered By: CMA Student CC: office visit for pain from neck to lower back ( right side), on going issue, day and night sweats,  on going issue Is Patient Diabetic? No Pain Assessment Patient in pain? yes     Location: hip/back/neck/side Intensity: 7 Type: aching  Does patient need assistance? Functional Status Self care Ambulation Normal   CC:  office visit for pain from neck to lower back ( right side), on going issue, day and night sweats, and on going issue.  History of Present Illness: 1.  Right neck, shoulder pain with radiation to middle 3 fingers.  Was supposed to have an MRI of Cspine, but did not tolerate the MRI--did not receive information regarding the problem that I can see in EMR.  Pt. did not notify the office and I do not see that radiology did as well.  Unable to get into echart currently to see if even a truncated report that did not get to [place].  Pt. ran out of Medicaid during the time she was to follow up with me so did not come for appt.  Has [employer] card now.  Pt. apparently became quite diaphoretic and nauseated in MRI and they had to remove her from the MRI after only 20 minutes.  Pain may be a bit worse in neck and shoulder.  Not clear if any more weakness in hand or arm--limited by pain.    2.  Pain in low back bilaterally--has had for some time.  No history of injury.  3.  Vulvar cancer:  pt. not sure if she is to be following up with Dr. Duard Brady or not.  Cannot recall when her last visit was or whether was released or not.  4.  Problems with urinary incontinence:  Has had for some time--using a pad regularly.  Loses urine control with coughing or  sneezing--generally already has urge to urinate.  Also has urgency and cannot hold.  Sometimes with dysuria as well--more on inside, not outside.  Current Medications (verified): 1)  Estradiol 0.0375 Mg/24hr  Ptwk (Estradiol) .... Apply Once A Week 2)  Aspirin Ec 81 Mg  Tbec (Aspirin) .Marland Kitchen.. 1 Tab By Mouth Daily 3)  Cymbalta 30 Mg Cpep (Duloxetine Hcl) .... 2 By Mouth At Bedtime 4)  Aldara 5 % Crea (Imiquimod) .... Appy Three Times A Week 5)  Diclofenac Sodium 75 Mg Tbec (Diclofenac Sodium) .Marland Kitchen.. 1 Tab By Mouth Two Times A Day With Food. 6)  Cyclobenzaprine Hcl 10 Mg Tabs (Cyclobenzaprine Hcl) .Marland Kitchen.. 1 Tab By Mouth Every 8 Hours As Needed For Neck and Shoulder Pain 7)  Ativan 1 Mg Tabs (Lorazepam) .... 1/2 To 1 Tab By Mouth 1/2 Hour Before Mri--Make Sure You Have A Ride  Allergies (verified): 1)  Codeine  Physical Exam  General:  Tearful at times--not sure what she is supposed to be doing with vulvar cancer and is fearful. Lungs:  Normal respiratory effort, chest expands symmetrically. Lungs are clear to auscultation, no crackles or wheezes. Heart:  Normal rate and regular  rhythm. S1 and S2 normal without gallop, murmur, click, rub or other extra sounds.  Radial pulses normal and equal Genitalia:  No obvious signs of inflammation of external vaginal orifice and vulvar area.  No leukoplakia noted Msk:  Very anxious appearing.  Holding shoulders stiffly. Tender over traps bilaterally Neurologic:  Right grip decreased somewhat, but limited by pain.alert & oriented X3, cranial nerves II-XII intact, and DTRs symmetrical and normal.  JUmps as if in pain with testing of reflexes.   Impression & Recommendations:  Problem # 1:  CERVICAL RADICULOPATHY, RIGHT (ICD-723.4) Take Diclofenac Orders: MRI without Contrast (MRI w/o Contrast)  Problem # 2:  URINARY INCONTINENCE, MIXED (ICD-788.33)  Start Detrol  Orders: UA Dipstick w/o Micro (manual) (54098) T-Culture, Urine ([id number])  Problem #  3:  LOW BACK PAIN SYNDROME (ICD-724.2)  The following medications were removed from the medication list:    Cyclobenzaprine Hcl 10 Mg Tabs (Cyclobenzaprine hcl) .Marland Kitchen... 1 tab by mouth every 8 hours as needed for neck and shoulder pain Her updated medication list for this problem includes:    Aspirin Ec 81 Mg Tbec (Aspirin) .Marland Kitchen... 1 tab by mouth daily    Diclofenac Sodium 75 Mg Tbec (Diclofenac sodium) .Marland Kitchen... 1 tab by mouth two times a day with food.  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 Denman [facility] asst. at [facility] Orders: T-Comprehensive Metabolic Panel [id number]) T-CBC w/Diff [phone])  Complete Medication List: 1)  Estradiol 0.0375 Mg/24hr Ptwk (Estradiol) .... Apply once a week 2)  Aspirin Ec 81 Mg Tbec (Aspirin) .Marland Kitchen.. 1 tab by mouth daily 3)  Cymbalta 30 Mg Cpep (Duloxetine hcl) .... 3 caps by mouth daily--[facility] 4)  Diclofenac Sodium 75 Mg Tbec (Diclofenac sodium) .Marland Kitchen.. 1 tab by mouth two times a day with food. 5)  Ativan 1 Mg Tabs (Lorazepam) .... 1/2 tab by mouth two times a day and 1 tab at bedtime as needed anxiety--dr. adegoroye at [facility] 6)  Detrol La 2 Mg Xr24h-cap (Tolterodine tartrate) .Marland Kitchen.. 1 cap by mouth daily 7)  Neurontin 400 Mg Caps (Gabapentin) .Marland Kitchen.. 1 cap by mouth two times a day --[facility]  Other Orders: T-Lipid Profile ([id number])  Patient Instructions: 1)  Follow up with Dr. Delrae Alfred in 2 months --urinary incontinence, neck and back pain Prescriptions: DETROL LA 2 MG XR24H-CAP (TOLTERODINE TARTRATE) 1 cap by mouth daily  #30 x 11   Entered and Authorized by:   Julieanne Manson MD   Signed by:   Julieanne Manson MD on 02/23/2010   Method used:   Faxed to ...       [facility] (retail)        [address] [place], [place]  [address]       Ph: [phone]       Fax: [phone]   RxID:   [id number]   Appended Document: HIP/BACK/SIDE PAIN/HOT FLASHES//KT  Laboratory Results   Urine Tests    Routine Urinalysis   Glucose: negative   (Normal Range: Negative) Bilirubin: negative   (Normal Range: Negative) Ketone: negative   (Normal Range: Negative) Spec. Gravity: 1.025   (Normal Range: 1.003-1.035) Blood: trace-intact   (Normal Range: Negative) pH: 6.5   (Normal Range: 5.0-8.0) Protein: negative   (Normal Range: Negative) Urobilinogen: 1.0   (Normal Range:  0-1) Nitrite: negative   (Normal Range: Negative) Leukocyte Esterace: trace   (Normal Range: Negative)

## 2010-07-30 NOTE — Assessment & Plan Note (Signed)
Summary: 2 MONTHS F/U APP //MC   Vital Signs:  Patient profile:   55 year old female Menstrual status:  hysterectomy Weight:      151.31 pounds BMI:     25.27 Temp:     97.8 degrees F oral Pulse rate:   74 / minute Pulse rhythm:   regular Resp:     18 per minute BP sitting:   122 / 66  (left arm) Cuff size:   regular  Vitals Entered By: Hale Drone CMA (June 05, 2010 10:52 AM) CC: 2 months f/u --urinary incontinence, neck and back pain. Also complaining of right leg pain that radiates all the way from her thigh, to knee, to calf and to the ankle Is Patient Diabetic? No Pain Assessment Patient in pain? yes       Does patient need assistance? Functional Status Self care Ambulation Normal     Menstrual Status hysterectomy   CC:  2 months f/u --urinary incontinence, neck and back pain. Also complaining of right leg pain that radiates all the way from her thigh, to knee, and to calf and to the ankle.  History of Present Illness: 1.  Cspine radiculopathy:  pt. did not go for MRI--states she cancelled on her own.  Just doesn't want to do even with sedation presribed.  Still having same symptoms.  Pt. did get a short stint of PT back in January, I cannot find the discharge papers.  Pt. states only went for 2 weeks--feels it helped, but just not long enough time to improve  2.  Urinary incontinence:  Did take the Detrol.  Was told by gyn to urinate on a regular schedule--this has helped more than anything.  Is having some dry mouth.  3.  Vulvar squamous cell carcinoma:  did follow up with Dr. Denman George office since last here.  To follow up with Dr Antionette Char as well.  That appt. was set up and pt. aware.  She was told she needs to follow up with Oncology until 2016.  4.  Low back pain:  still just as bad despite pain meds.    5.  Right leg pain:  started about 2 months ago.  No definite injury.  Starts mid thigh and goes to mid calf.  Last bad episode was last  week.  Current Medications (verified): 1)  Estradiol 0.0375 Mg/24hr  Ptwk (Estradiol) .... Apply Once A Week 2)  Aspirin Ec 81 Mg  Tbec (Aspirin) .Marland Kitchen.. 1 Tab By Mouth Daily 3)  Cymbalta 30 Mg Cpep (Duloxetine Hcl) .... 3 Caps By Mouth Daily--[facility] 4)  Diclofenac Sodium 75 Mg Tbec (Diclofenac Sodium) .Marland Kitchen.. 1 Tab By Mouth Two Times A Day With Food. 5)  Ativan 1 Mg Tabs (Lorazepam) .... 1/2 Tab By Mouth Two Times A Day and 1 Tab At Bedtime As Needed Anxiety--[facility] Cammy Copa [facility] 6)  Detrol La 2 Mg Xr24h-Cap (Tolterodine Tartrate) .Marland Kitchen.. 1 Cap By Mouth Daily 7)  Neurontin 400 Mg Caps (Gabapentin) .Marland Kitchen.. 1 Cap By Mouth Two Times A Day --[facility]  Allergies (verified): 1)  Codeine  Physical Exam  General:  NAD Lungs:  Normal respiratory effort, chest expands symmetrically. Lungs are clear to auscultation, no crackles or wheezes. Heart:  Normal rate and regular rhythm. S1 and S2 normal without gallop, murmur, click, rub or other extra sounds.  Radial pulses normal and equal Extremities:  Tender all over knee and distal femur area--states more tender on palpation of bone rather than  soft tissue.  No swelling palpated.   Impression & Recommendations:  Problem # 1:  LEG PAIN, RIGHT (ICD-729.5) Not sure will find anything--pt. seems to have generalized complaints  Orders: Diagnostic X-Ray/Fluoroscopy (Diagnostic X-Ray/Flu) Physical Therapy Referral (PT)  Problem # 2:  URINARY INCONTINENCE, MIXED (ICD-788.33) Pt. to stop Detrol, continue with schedule bathroom visits and see if symptoms just as well controlled  Problem # 3:  CERVICAL RADICULOPATHY, RIGHT (ICD-723.4)  Orders: Physical Therapy Referral (PT)  Problem # 4:  LOW BACK PAIN SYNDROME (ICD-724.2)  Her updated medication list for this problem includes:    Aspirin Ec 81 Mg Tbec (Aspirin) .Marland Kitchen... 1 tab by mouth daily    Diclofenac Sodium 75 Mg Tbec (Diclofenac sodium) .Marland Kitchen... 1 tab by mouth two times a day with  food.  Orders: Physical Therapy Referral (PT)  Complete Medication List: 1)  Estradiol 0.0375 Mg/24hr Ptwk (Estradiol) .... Apply once a week 2)  Aspirin Ec 81 Mg Tbec (Aspirin) .Marland Kitchen.. 1 tab by mouth daily 3)  Cymbalta 30 Mg Cpep (Duloxetine hcl) .... 3 caps by mouth daily--[facility] 4)  Diclofenac Sodium 75 Mg Tbec (Diclofenac sodium) .Marland Kitchen.. 1 tab by mouth two times a day with food. 5)  Ativan 1 Mg Tabs (Lorazepam) .... 1/2 tab by mouth two times a day and 1 tab at bedtime as needed anxiety--dr. adegoroye at [facility] 6)  Detrol La 2 Mg Xr24h-cap (Tolterodine tartrate) .Marland Kitchen.. 1 cap by mouth daily 7)  Neurontin 400 Mg Caps (Gabapentin) .Marland Kitchen.. 1 cap by mouth two times a day --[facility]  Patient Instructions: 1)  Call if you do not hear from Physical therapy in the next 2 weeks 2)  Follow up with Dr. Delrae Alfred in 3 months--neck and back pain   Orders Added: 1)  Diagnostic X-Ray/Fluoroscopy [Diagnostic X-Ray/Flu] 2)  Physical Therapy Referral [PT] 3)  Est. Patient Level III [81191]   Not Administered:    Influenza Vaccine not given due to: declined

## 2010-07-30 NOTE — Miscellaneous (Signed)
Summary: PHYSICIANS TREATMENT PLAN  PHYSICIANS TREATMENT PLAN   Imported By: Arta Bruce 09/12/2009 11:31:09  _____________________________________________________________________  External Attachment:    Type:   Image     Comment:   External Document

## 2010-07-30 NOTE — Letter (Signed)
Summary: MED/SOLUTIONS /APPROVED  MED/SOLUTIONS /APPROVED   Imported By: Arta Bruce 11/17/2009 11:50:26  _____________________________________________________________________  External Attachment:    Type:   Image     Comment:   External Document

## 2010-07-30 NOTE — Letter (Signed)
Summary: Lipid Letter  [facility]  [address] [place], [place] [address]   Phone: [phone]  Fax: [phone]    03/06/2010  [name] [address] [place], [place]  [address]  Dear [name]:  We have carefully reviewed your last lipid profile from 02/23/2010 and the results are noted below with a summary of recommendations for lipid management.    Cholesterol:       214     Goal: <200   HDL "good" Cholesterol:   47     Goal: >45   LDL "bad" Cholesterol:   143     Goal: <100   Triglycerides:       118     Goal: <150    Your cholesterol is a bit high.  Work on eating lots of fruits and vegetables and avoiding a lot of red meat or fried fatty foods.  The rest of your lab work was okay.  No urine infection.  We'll see if Detrol helps with urine symptoms.  I called the cancer center and left a message with Dr. Hardie Pulley not yet heard back.    TLC Diet (Therapeutic Lifestyle Change): Saturated Fats & Transfatty acids should be kept < 7% of total calories ***Reduce Saturated Fats Polyunstaurated Fat can be up to 10% of total calories Monounsaturated Fat Fat can be up to 20% of total calories Total Fat should be no greater than 25-35% of total calories Carbohydrates should be 50-60% of total calories Protein should be approximately 15% of total calories Fiber should be at least 20-30 grams a day ***Increased fiber may help lower LDL Total Cholesterol should be < 200mg /day Consider adding plant stanol/sterols to diet (example: Benacol spread) ***A higher intake of unsaturated fat may reduce Triglycerides and Increase HDL    Adjunctive Measures (may lower LIPIDS and reduce risk of Heart Attack) include: Aerobic Exercise (20-30 minutes 3-4 times a week) Limit Alcohol Consumption Weight Reduction Aspirin 75-81 mg a day by mouth (if not allergic or contraindicated) Dietary Fiber 20-30 grams a day by mouth     Current  Medications: 1)    Estradiol 0.0375 Mg/24hr  Ptwk (Estradiol) .... Apply once a week 2)    Aspirin Ec 81 Mg  Tbec (Aspirin) .Marland Kitchen.. 1 tab by mouth daily 3)    Cymbalta 30 Mg Cpep (Duloxetine hcl) .... 3 caps by mouth daily--[facility] 4)    Diclofenac Sodium 75 Mg Tbec (Diclofenac sodium) .Marland Kitchen.. 1 tab by mouth two times a day with food. 5)    Ativan 1 Mg Tabs (Lorazepam) .... 1/2 tab by mouth two times a day and 1 tab at bedtime as needed anxiety--dr. adegoroye at [facility] 6)    Detrol La 2 Mg Xr24h-cap (Tolterodine tartrate) .Marland Kitchen.. 1 cap by mouth daily 7)    Neurontin 400 Mg Caps (Gabapentin) .Marland Kitchen.. 1 cap by mouth two times a day --[facility]  If you have any questions, please call. We appreciate being able to work with you.   Sincerely,    [facility] Julieanne Manson MD

## 2010-07-30 NOTE — Letter (Signed)
Summary: REFERRAL//PHYSICAL THERAPY  REFERRAL//PHYSICAL THERAPY   Imported By: Arta Bruce 07/21/2009 08:59:31  _____________________________________________________________________  External Attachment:    Type:   Image     Comment:   External Document

## 2010-07-30 NOTE — Consult Note (Signed)
Summary: Consultation Report/[place]  Consultation Report/[place]   Imported By: Arta Bruce 07/07/2010 14:01:06  _____________________________________________________________________  External Attachment:    Type:   Image     Comment:   External Document

## 2010-07-31 ENCOUNTER — Encounter (INDEPENDENT_AMBULATORY_CARE_PROVIDER_SITE_OTHER): Payer: Self-pay | Admitting: Internal Medicine

## 2010-08-05 NOTE — Miscellaneous (Signed)
Summary: Rehab Report//INITIAL SUMMARY  Rehab Report//INITIAL SUMMARY   Imported By: Arta Bruce 07/31/2010 11:17:58  _____________________________________________________________________  External Attachment:    Type:   Image     Comment:   External Document

## 2010-08-06 ENCOUNTER — Encounter: Payer: Self-pay | Admitting: Physical Therapy

## 2010-08-06 ENCOUNTER — Ambulatory Visit: Payer: Self-pay

## 2010-08-07 ENCOUNTER — Encounter: Payer: Self-pay | Admitting: Physical Therapy

## 2010-08-07 ENCOUNTER — Ambulatory Visit: Payer: Self-pay | Attending: Internal Medicine | Admitting: Physical Therapy

## 2010-08-07 DIAGNOSIS — R5381 Other malaise: Secondary | ICD-10-CM | POA: Insufficient documentation

## 2010-08-07 DIAGNOSIS — M545 Low back pain, unspecified: Secondary | ICD-10-CM | POA: Insufficient documentation

## 2010-08-07 DIAGNOSIS — IMO0001 Reserved for inherently not codable concepts without codable children: Secondary | ICD-10-CM | POA: Insufficient documentation

## 2010-08-07 DIAGNOSIS — M2560 Stiffness of unspecified joint, not elsewhere classified: Secondary | ICD-10-CM | POA: Insufficient documentation

## 2010-08-07 DIAGNOSIS — M542 Cervicalgia: Secondary | ICD-10-CM | POA: Insufficient documentation

## 2010-08-07 DIAGNOSIS — M6281 Muscle weakness (generalized): Secondary | ICD-10-CM | POA: Insufficient documentation

## 2010-08-07 DIAGNOSIS — M2550 Pain in unspecified joint: Secondary | ICD-10-CM | POA: Insufficient documentation

## 2010-08-10 ENCOUNTER — Ambulatory Visit: Payer: Self-pay

## 2010-09-24 ENCOUNTER — Encounter (INDEPENDENT_AMBULATORY_CARE_PROVIDER_SITE_OTHER): Payer: Self-pay | Admitting: Internal Medicine

## 2010-09-29 NOTE — Letter (Signed)
Summary: MAILED REQUESTED RECORDS TO [facility]  MAILED REQUESTED RECORDS TO [facility]   Imported By: Arta Bruce 09/24/2010 12:15:07  _____________________________________________________________________  External Attachment:    Type:   Image     Comment:   External Document

## 2010-10-05 LAB — BASIC METABOLIC PANEL
BUN: 5 mg/dL — ABNORMAL LOW (ref 6–23)
Calcium: 9.4 mg/dL (ref 8.4–10.5)
GFR calc non Af Amer: >60 mL/min (ref >60)
Glucose, Bld: 94 mg/dL (ref 70–99)

## 2010-10-05 LAB — CBC
HCT: 38.2 % (ref 36.0–46.0)
MCHC: 33.7 g/dL (ref 30.0–36.0)
Platelets: 268 10*3/uL (ref 150–400)
RDW: 14.9 % (ref 11.5–15.5)

## 2010-10-05 LAB — DIFFERENTIAL
Basophils Absolute: 0.1 10*3/uL (ref 0.0–0.1)
Basophils Relative: 1 % (ref 0–1)
Eosinophils Absolute: 0.2 10*3/uL (ref 0.0–0.7)
Monocytes Absolute: 1.1 10*3/uL — ABNORMAL HIGH (ref 0.1–1.0)
Neutro Abs: 4.9 10*3/uL (ref 1.7–7.7)
Neutrophils Relative %: 52 % (ref 43–77)

## 2010-10-07 LAB — DIFFERENTIAL
Basophils Absolute: 0.1 10*3/uL (ref 0.0–0.1)
Eosinophils Relative: 2 % (ref 0–5)
Lymphocytes Relative: 39 % (ref 12–46)
Lymphs Abs: 3.4 10*3/uL (ref 0.7–4.0)
Monocytes Relative: 10 % (ref 3–12)
Neutrophils Relative %: 48 % (ref 43–77)

## 2010-10-07 LAB — CBC
HCT: 39.4 % (ref 36.0–46.0)
Platelets: 314 10*3/uL (ref 150–400)
RDW: 15.1 % (ref 11.5–15.5)
WBC: 8.7 10*3/uL (ref 4.0–10.5)

## 2010-10-07 LAB — BASIC METABOLIC PANEL
BUN: 7 mg/dL (ref 6–23)
Calcium: 9.3 mg/dL (ref 8.4–10.5)
GFR calc non Af Amer: >60 mL/min (ref >60)
Potassium: 3.9 mEq/L (ref 3.5–5.1)

## 2010-10-14 ENCOUNTER — Ambulatory Visit: Payer: Self-pay | Attending: Gynecologic Oncology | Admitting: Gynecologic Oncology

## 2010-10-14 DIAGNOSIS — C519 Malignant neoplasm of vulva, unspecified: Secondary | ICD-10-CM | POA: Insufficient documentation

## 2010-10-14 DIAGNOSIS — R079 Chest pain, unspecified: Secondary | ICD-10-CM | POA: Insufficient documentation

## 2010-10-14 DIAGNOSIS — Z79899 Other long term (current) drug therapy: Secondary | ICD-10-CM | POA: Insufficient documentation

## 2010-10-14 DIAGNOSIS — M25559 Pain in unspecified hip: Secondary | ICD-10-CM | POA: Insufficient documentation

## 2010-10-14 DIAGNOSIS — Z90710 Acquired absence of both cervix and uterus: Secondary | ICD-10-CM | POA: Insufficient documentation

## 2010-10-16 NOTE — Consult Note (Signed)
[name], [name]                ACCOUNT NO.:  [id number]  MEDICAL RECORD NO.:  [id number]           PATIENT TYPE:  LOCATION:                                 FACILITY:  PHYSICIAN:  Abdallah Hern Kendra. Duard Brady, MD    DATE OF BIRTH:  1955-12-29  DATE OF CONSULTATION:  10/14/2010 DATE OF DISCHARGE:                                CONSULTATION   The patient is Kendra 55 year Eaton with multifocal vulvar carcinoma.  [name] initially underwent Kendra radical right vulvectomy and left-sided wide local excision with CO2 laser ablation, July 2009.  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  In June of that year, we performed re-excision that again showed invasive disease.  [name] had been lost to follow up from [place] since October 2010 until [name] was recently seen by Dr. Nelly Rout in September 2011.  At that time, there was no evidence of recurrent disease.  [name] comes in today for followup.  [name] states that [name] saw Dr. Tamela Oddi several weeks ago secondary to some itching and burning in the vulva.  [name] was given Kendra prescription for some medication and the discomfort improved.  [name] does complain of pain in [name] right buttock.  [name] states that [name] did speak to Dr. Delrae Alfred about this and [name] had x-rays which were negative, but [name] states the pain is not really in [name] hip.  It is more in [name] right buttocks and it does radiate down the back of [name] right leg and anterior right leg is not sore to touch.  [name] was supposed to see Dr. Delrae Alfred in March but that appointment was rescheduled to this coming Friday.  REVIEW OF SYSTEMS:  [name] denies any chest pain, shortness of breath, nausea, vomiting, fevers, chills, headaches, visual changes.  When I asked [name] again about [name] exercise, [name]  states that [name] is not very active when [name] is exercise, but occasional with exercise [name] will experience chest pain, so we readdress the issue of the chest pain and [name] states [name] occasionally has chest pain at the time of rest, also with activity.  [name] states that [name] has not really noticed that the pain goes up [name] neck or down [name] arm, but [name] has had an episode where [name] had right neck and right arm pain.  [name] was seen by Kendra chiropractor for 2 visits and the symptoms resolved.  [name] does complain of urge incontinence.  When [name] saw Dr. Nelly Rout, [name] stated that the Detrol was helping with [name] symptoms.  However, today [name] is not sure if it has really helped.  [name] states that [name] may want to give it Kendra little bit more time and I cannot ensure that [name] has been completely compliant with it.  [name] denies any chest pain currently today.  Review of systems is otherwise negative.  PAST MEDICAL HISTORY: 1. Hysterectomy 2001 for fibroids. 2. Full mouth  dental extractions. 3. Radical vulvectomy in 2009-1010 x2.  MEDICATIONS: 1. Estradiol 0.037 patch weekly. 2. Baby aspirin daily. 3. Cymbalta daily. 4. Diclofenac 75 mg twice daily. 5. Cyclobenzaprine 1 q.8h. 6. Ativan 1 q.h.s. 7. Detrol 2 mg daily. 8. Neurontin 400 mg daily.  PHYSICAL EXAMINATION:  VITAL SIGNS:  Weight 145 pounds, which is down 4 pounds from [name] last visit.  Blood pressure 130/68, pulse 78. GENERAL:  Well-nourished, well-developed female, in no acute distress. NECK:  Supple.  There is no lymphadenopathy, no thyromegaly. LUNGS:  Clear to auscultation bilaterally. CARDIOVASCULAR:  Regular rate and rhythm. ABDOMEN:  Soft, nontender, nondistended.  There are no palpable masses or hepatosplenomegaly.  Groins are negative for adenopathy. EXTREMITIES:  No edema. PELVIC:  External genitalia is notable for surgical excision of the vulva.  There is no visible lesions.  There are no palpable masses.  ASSESSMENT:  [name]  with history of multifocal vulvar carcinoma, who clinically has no evidence of recurrent disease.  [name] has multiple other complaints, however. 1. With regard to [name] chest pain, [name] review of systems is very     difficult to elucidate.  [name] will be seeing Dr. Julieanne Manson     this coming Friday and we asked [name] to address this with Dr.     Delrae Alfred. 2. With regard to [name] hip pain, it does sound like this could be     sciatica and [name] will certainly address this with [name] primary care     physician. 3. With regard to [name] cancer, [name] has no evidence of disease at this     point.  [name] will see Dr. Tamela Oddi in 3 months and return to     see [place] in 6.     [name]. Duard Brady, MD     PAG/MEDQ  D:  10/14/2010  T:  10/14/2010  Job:  [id number]  cc:   [name], M.D. Fax: [phone]  Telford Nab, R.N. [address] [place], [place] [address]  Marcene Duos, M.D.  Electronically Signed by Cleda Mccreedy MD on 10/16/2010 09:06:22 AM

## 2010-10-27 ENCOUNTER — Other Ambulatory Visit (HOSPITAL_COMMUNITY): Payer: Self-pay | Admitting: Internal Medicine

## 2010-10-27 DIAGNOSIS — Z1231 Encounter for screening mammogram for malignant neoplasm of breast: Secondary | ICD-10-CM

## 2010-11-04 ENCOUNTER — Ambulatory Visit (HOSPITAL_COMMUNITY)
Admission: RE | Admit: 2010-11-04 | Discharge: 2010-11-04 | Disposition: A | Discharge status: Home / Self Care | Payer: Self-pay | Source: Ambulatory Visit | Attending: Internal Medicine | Admitting: Internal Medicine

## 2010-11-04 DIAGNOSIS — Z1231 Encounter for screening mammogram for malignant neoplasm of breast: Secondary | ICD-10-CM | POA: Insufficient documentation

## 2010-11-10 NOTE — Op Note (Signed)
[name], [name]                ACCOUNT NO.:  [id number]   MEDICAL RECORD NO.:  [id number]          PATIENT TYPE:  AMB   LOCATION:  DAY                          FACILITY:  [facility]   PHYSICIAN:  Paola A. Duard Brady, MD    DATE OF BIRTH:  1955/08/02   DATE OF PROCEDURE:  DATE OF DISCHARGE:                               OPERATIVE REPORT   PREOPERATIVE DIAGNOSIS:  Vulvar carcinoma.   POSTOPERATIVE DIAGNOSIS:  Vulvar carcinoma.   PROCEDURE:  1. Right superficial groin node dissection.  2. Right modified radical vulvectomy.  3. Left wide local excision, CO2 laser of the vulva.   SURGEONS:  Paola A. Duard Brady, MD and [name], M.D.   ASSISTANT:  Telford Nab, R.N.   ANESTHESIA:  General.   ANESTHESIOLOGIST:  Jenelle Mages. Fortune, M.D.   ESTIMATED BLOOD LOSS:  Less than 100 mL.   URINE OUTPUT:  1100 mL.   IV FLUIDS:  1700 mL.   SPECIMENS:  Included the right groin nodes, right vulva, left vulva.   COMPLICATIONS:  None.   OPERATIVE FINDINGS:  Included a 2-cm erosive lesion encompassing the  right labia minora.  She had a 5 x 5 cm plaque hyperkeratotic lesion on  the left gluteus/labia majora and hyperkeratotic changes along the  perineal body.  There was no significantly palpable or visually enlarged  lymph nodes.   The patient was taken to the operating room, placed in the supine  position where general anesthesia was induced.  She was then placed in  dorsal lithotomy position with all appropriate precautions being taken.  The abdomen and groin were prepped in the usual fashion.  The perineum  was prepped in the usual fashion.  Foley catheter was inserted into the  bladder under sterile conditions.   Our attention was first drawn to the groin node dissection.  A 10-cm  incision from the pubic tubercle to the ACIS was made with the knife and  carried down to camper's fascia using Bovie cautery.  Camper's fascia  was excised.  The nodal bundle superior to the deep  fascia and inferior  to camper's fascia was taken through.  There were some small perforators  from the saphenous vein on the medial aspect that were taken with Bovie  cautery.  The nodal bundle was removed in its entirety and handed for  permanent section.  There was no palpably enlarged nodes deep to the  cribriform fascia.  The areas copiously irrigated.   A stab wound was made lateral to the incision and a #10 JP drain was  placed under camper's fascia.  Camper's fascia was closed using  interrupted sutures of 2-0 Vicryl.  The subcu tissues were irrigated,  reapproximating sutures with 3-0 Vicryl placed.  The skin was closed  using a 4-0 closure in a running fashion.  Steri-Strips were placed.  The drain was sewn in with nylon.   Our attention was then drawn to the vulva.  We first our attention to  the right vulvectomy.  The lesion as above was noted.  A 1.5 cm lateral  and 1 cm  medial margin around the lesion was created with the knife.  We  went down to the deep fascia using Bovie cautery.  Where there larger  perforators and bleeders, they were clamped with Burlisher clamps and  suture ligated with 2-0 Vicryl.  This was continued inferiorly with  adequate margin.  The specimen was handed off and marked for pathology.  Hemostasis was obtained using cautery as indicated.  Deep sutures of 2-0  Vicryl were used to reapproximate the subcu tissues.  Then 2-0 Vicryl  was used for the subdermal layer, and the interrupted 3-0 were placed to  reapproximate the skin.   Our attention was then drawn to the left gluteal lesion.  Again, it  measured approximately 5 x 5 cm.  An elliptical incision from the  inferior aspect of the left labia majora to approximately 0.5 cm lateral  to the incision was made with the knife.  A skinning vulvectomy was  performed some layers were somewhat deeper due to the thick course  nature of the tissue.  The lesion was removed.   The anal sphincter well was  well spared, and noted to be deep to the  area of dissection.  This was the skinning vulvectomy.  The tissues were  made hemostatic with pinpoint cautery.  A 2-0 Vicryl to used to  reapproximate the deeper tissue layers.  Interrupted 3-0 suture was used  to close the skin.   Our attention was then drawn to the perineal body in an area in between  both the radical vulvectomy incision on the right and the wide local on  the left.  There were hyperkeratotic raised lesions which had been  biopsied consistent with CIN III.  Due to the anatomy and the close  proximity of the lesions, we decided to proceed with laser ablation  versus wide local and skinning vulvectomy to spare introital competency.  CO2 laser was then used to ablate the hyperkeratotic lesion.  The power  watt was 5.  The blade entered at the appropriate depth and the area was  noted to be hemostatic.  Silvadene was then applied.  All suture lines  were noted to be hemostatic.  The area was cleansed, Silvadene ointment  was placed on the laser region.   The patient tolerated the procedure well, and was taken to the recovery  room in stable condition.  All instrument, needle, and Ray-Tec counts  were correct x2.      Paola A. Duard Brady, MD  Electronically Signed     PAG/MEDQ  D:  01/02/2008  T:  01/02/2008  Job:  [id number]   cc:   Telford Nab, R.N.  [address]  [place], [place] [address]   [name], M.D.  Fax: [phone]   Noralyn Pick. Eden Emms, MD, [facility]  [address]  [address]  [place]  [place] [address]

## 2010-11-10 NOTE — Consult Note (Signed)
[name], SERVICE                ACCOUNT NO.:  [id number]   MEDICAL RECORD NO.:  [id number]          PATIENT TYPE:  OUT   LOCATION:  GYN                          FACILITY:  [facility]   PHYSICIAN:  Paola A. Duard Brady, MD    DATE OF BIRTH:  09/01/1955   DATE OF CONSULTATION:  11/13/2008  DATE OF DISCHARGE:                                 CONSULTATION   Patient is a very pleasant 55 year old with history of a stage I vulvar  carcinoma.  In July of 2009, she underwent excision surgery with groin  nodes.  Final pathology was consistent with squamous cell carcinoma  negative margins.  She was seen in March by Dr. Ronita Hipps.  Biopsy was  done that revealed squamous cell carcinoma in situ.  She subsequently  underwent a wide local excision with me on April 27. 2010.  Operative  findings included a thickened hyperkeratotic raised area starting at 7  o'clock on the right perineum extending across the midline involving the  posterior fourchette.  The total area measured approximately 3 x 1.5 cm.  Final pathology revealed an invasive moderately differentiated squamous  cell carcinoma in situ in a background of squamous cell carcinoma in  situ.  The lateral and deep margins at 12 o'clock were positive by  invasive and in situ carcinoma.  Lateral margin at 3 and 9 was positive  for in situ carcinoma.  There was no lymphovascular space involvement.  She comes in today for a postoperative check.  She does complain of some  nausea this morning though she is eating.  She denies any emesis.  We  gave her some ginger ale and that is helping her feel a little bit  better.  She states that postoperatively she did very well.  She  continues to smoke about 1/2 a pack to 3/4 of a pack a day.  I discussed  with her the results of the positive margin and the need for re-  excision.  She is tearful, seems to understand the severity and gravity  of the situation.   PHYSICAL EXAMINATION:  Weight 154 pounds.  Blood  pressure 130/80.  Well-  nourished, well-developed, tearful female in no acute distress.  PELVIC:  External genitalia is notable for surgical excision of the  right vulva.  On the posterior fourchette perineal body, there has been  complete separation of all suture line.  The patient denied any  knowledge of this having opened up.  There is 1 residual suture there  that was removed without difficulty.   ASSESSMENT:  A 55 year old with recurrent vulvar carcinoma in the  posterior fourchette with a positive margin done at the time of wide  local excision for what was presumed to be VIN-III.   PLAN:  1. We will need to proceed with re-excision.  I am not sure if I will      close this incision this time as both times we have done her      surgery she has had complete disruption of the surgical incisions.      We may leave it  open and just provide hemostasis.  She understands      the risks and benefits of surgery and wishes to proceed though of      course she is upset about the positive margin.  She does understand      the need to try to remove to negative margins status.  2. We discussed smoking cessation.  3. She states that she is very depressed.  She is on medications under      the care of Dr. Julieanne Manson for this.  She will discuss this      with Dr. Delrae Alfred.      Paola A. Duard Brady, MD  Electronically Signed     PAG/MEDQ  D:  11/13/2008  T:  11/13/2008  Job:  [id number]   cc:   [name], M.D.  Fax: [phone]   Telford Nab, R.N.  [address]  [place], [place] [address]   Noralyn Pick. Eden Emms, MD, [facility]  [address]  [address]  [place]  [place] [address]   Marcene Duos, M.D.

## 2010-11-10 NOTE — Consult Note (Signed)
[name]                ACCOUNT NO.:  [id number]   MEDICAL RECORD NO.:  [id number]          PATIENT TYPE:  OUT   LOCATION:  GYN                          FACILITY:  [facility]   PHYSICIAN:  Paola A. Duard Brady, MD    DATE OF BIRTH:  1955-10-16   DATE OF CONSULTATION:  02/13/2008  DATE OF DISCHARGE:                                 CONSULTATION   [name] is a very pleasant 55 year old who on January 02, 2008, underwent  right superficial groin node dissection, right modified radical  vulvectomy, left-sided wide local excision and CO2 laser of the vulva  for a stage I vulvar carcinoma with negative nodes.  She had complete  disruption of all her surgical incisions.  She has been seen every 2  weeks since the time of her surgery.  I most recently saw her on January 30, 2008, at which time the left incision was almost completely healed.  The laser ablated area had healed and within the right vulva there had  been approximately 50% closure.  She comes in today for follow-up.  She  states that she feels like it is healing quite well.  She does have  occasional shooting pains, but they are fairly well tolerated.   PHYSICAL EXAMINATION:  VITAL SIGNS:  Weight 144 pounds, blood pressure  128/76.  GENERAL:  A well-nourished, well-developed female in no acute distress.   Groin is well healed.  The vulva was inspected.  The left vulva has  almost completely healed, there is just some deep depigmentation that  needs to resolve.  The area of laser ablation has completely healed.  Within the right vulva, there is almost complete closure.  There is a  small amount of separation.  It is difficult to ascertain whether there  may be a small amount of condylomatous disease or whether there is some  hypertrophic tissue from the healing of secondary intention.  The area  is very clean with no exudate.   ASSESSMENT:  A 55 year old, status post vulvectomy for stage I vulvar  carcinoma who suffered a wound  disruption.  She is doing very well.   PLAN:  She will continue doing her sitz baths as recommended.  She will  return to see me in 1 month.      Paola A. Duard Brady, MD  Electronically Signed     PAG/MEDQ  D:  02/13/2008  T:  02/13/2008  Job:  [id number]   cc:   [name], R.N.  [address]  [place], [place] [address]   [name], M.D.  Fax: [phone]   [name], M.D.   Kendra Pick. Eden Emms, MD, [facility]  [address]  [address]  [place]  [place] [address]

## 2010-11-10 NOTE — H&P (Signed)
NAMEMENAAL, RUSSUM                ACCOUNT NO.:  [id number]   MEDICAL RECORD NO.:  [id number]          PATIENT TYPE:  AMB   LOCATION:  DAY                          FACILITY:  [facility]   PHYSICIAN:  Paola A. Duard Brady, MD    DATE OF BIRTH:  [birth year]   DATE OF ADMISSION:  DATE OF DISCHARGE:                              HISTORY & PHYSICAL   The patient is a 55 year old who was initially for to me by Dr. Tamela Oddi.  When we initially saw her Oct 31, 2007 she had an erosive lesion  replacing the labia minora in its entirety which did not cross the  midline and appeared to be unilateral.  There is a large hyperkeratotic  raised plaque on the left labia near the posterior fourchette in the  perianal region measuring approximately 3 cm.  There were several other  warty hyperkeratotic raised lesions.  This lesion on the patient's right  side had been biopsied and was consistent with squamous cell carcinoma.  The patient consented to undergo a modified radical vulvectomy on right  side losing her labia minora and potentially her clitoris.  She would  also require right groin dissection superficial on the right side with a  Jackson-Pratt drain postoperatively and biopsies and laser ablation of  the perianal and perineal lesions throughout as well as a wide local  excision of the left-sided lesion.  The patient was seen and agreed to  the procedure.  Because of her extensive past medical history, she was  evaluated for a preoperative risk stratification.  She had a low-risk  adenosine stress nuclear study.  She was seen by Dr. Charlton Haws who  felt that she would be a reasonable risk for surgery based on her  negative stress test.   PAST MEDICAL HISTORY:  1. Hysterectomy 2001 for fibroids.  2. Teeth removal.   ALLERGIES:  CODEINE.   SOCIAL HISTORY:  She smokes a pack per day, she has done for 37 years.   MEDICATIONS:  Hormone replacement therapy patches.   PHYSICAL EXAMINATION:   Well-nourished, well-developed female in no acute  distress.  Vital Signs will be obtained on the day of surgery.  PELVIC:  External genitalia is normal for erosive lesion replaced in the  labia majora and minora in its entirety without crossing the midline.  There is hyperkeratotic raised plaque the left labia near posterior  fourchette in the perianal region which measures approximately 3 cm.  There were several warty hyperkeratotic lesions throughout the posterior  fourchette.   ASSESSMENT:  A 55 year old a clinical stage II vulvar carcinoma.   PLAN:  She will undergo right-sided modified radical vulvectomy with  right superficial groin no dissection laser ablation of perineal lesions  and wide local excision of the left labia.  The patient was consented  appropriately.  Risks and benefits of surgery were discussed with the  patient and she wishes to proceed.  Her surgery is scheduled for January 02, 2008.      Paola A. Duard Brady, MD  Electronically Signed     PAG/MEDQ  D:  12/26/2007  T:  12/26/2007  Job:  [id number]   cc:   [name], M.D.  Fax: [phone]   Noralyn Pick. Eden Emms, MD, [facility]  [address]  [address]  [place]  [place] [address]   Telford Nab, R.N.  [phone] [address]  [place], [place] [address]

## 2010-11-10 NOTE — Consult Note (Signed)
NAMELISMARY, KIEHN                ACCOUNT NO.:  [id number]   MEDICAL RECORD NO.:  [id number]          PATIENT TYPE:  OUT   LOCATION:  GYN                          FACILITY:  [facility]   PHYSICIAN:  Paola A. Duard Brady, MD    DATE OF BIRTH:  [birth year]   DATE OF CONSULTATION:  12/17/2008  DATE OF DISCHARGE:                                 CONSULTATION   The patient is a 55 year old with a stage I vulvar carcinoma who in July  of 2009 underwent excision with groin node.  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  In March of 2010 biopsy was done by Dr. Kyla Balzarine that revealed  squamous cell carcinoma in situ and she subsequently underwent a wide  local excision done by me on October 22, 2008.  Operative findings  included a thickened hyperkeratotic raised area starting at 7 o'clock.  Total area measured approximately 3 x 1.5 cm.  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  The  margins were positive.  As a result on December 03, 2008 she underwent  radical wide local excision of the vulva.  Operative findings included a  small amount of acetowhite hyperkeratotic areas at 7 o'clock on the  posterior fourchette.  There was no obvious lesion.  She had a wide  resection which left [place] with approximately a 6 x 5 x 4 cm defect.  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  There was in situ disease at the superior anterior skin margin  on the right.  All other margins were negative and the margins for  carcinoma were negative.  The depth of stromal invasion was 2.5 mm.  There was no lymphovascular space involvement.  She comes in today for a  postoperative check.  She is overall doing fairly well.  She has cut  down to approximately three to four cigarettes per day using Nicorette  gum and is  otherwise doing fairly well.  She does have a small amount of  spotting on her pad.  She states she is doing her sitz baths.   PHYSICAL EXAMINATION:  Weight 155 pounds, blood pressure 130/78.  Well-  nourished, well-developed female in no acute distress.  PELVIC:  External genitalia shows that the lesion is fairly well  approximated.  It is clean.  Several sutures that were no longer intact  were removed without difficulty.   ASSESSMENT:  [age]-year-old with recurrent stage I vulvar carcinoma  who is status post recent radical excision and is doing well from  postoperative standpoint.  She was encouraged and congratulated  regarding her smoking cessation.   PLAN:  1. She will follow up with me in 3-4 weeks for a postoperative check.  2. She will continue her smoking cessation.  3. She will also continue her sitz baths and local wound care and call      [place] if there are any issues.      Rejeana Brock  Bernie Covey, MD  Electronically Signed     PAG/MEDQ  D:  12/17/2008  T:  12/17/2008  Job:  [id number]   cc:   Telford Nab, R.N.  [address]  [place], [place] [address]   [name], M.D.  Fax: [phone]   Noralyn Pick. Eden Emms, MD, [facility]  [address]  [address]  [place]  [place] [address]   Marcene Duos, M.D.

## 2010-11-10 NOTE — Consult Note (Signed)
NAMEZAKEYA, JUNKER                ACCOUNT NO.:  [id number]   MEDICAL RECORD NO.:  [id number]          PATIENT TYPE:  OUT   LOCATION:  GYN                          FACILITY:  [facility]   PHYSICIAN:  Paola A. Duard Brady, MD    DATE OF BIRTH:  [birth year]   DATE OF CONSULTATION:  01/08/2009  DATE OF DISCHARGE:                                 CONSULTATION   The patient is a 55 year old with stage I vulvar cancer who in July 2009  underwent excision, radical vulvectomy with groin node dissection.  Final pathology was consistent with squamous cell carcinoma with  negative margins and 0/6 involved lymph nodes.  In March 2010, a biopsy  was done by Dr. Kyla Balzarine that reveals squamous cell carcinoma in situ and  she underwent a wide local excision in April 2010.  Operative findings  include a thickened hyperkeratotic raised area starting at 7 o'clock.  Total measured about 5 x 1.5 cm.  Final pathology revealed, however, an  invasive moderately differentiated squamous cell carcinoma.  The margins  were positive.  Subsequently, in June 2010, she underwent a radical wide  local excision of the vulva.  Operative findings included a small area  of acetowhite hyperkeratotic lesion at 7 o'clock.  There was no obvious  lesion on excision with a 6 x 5 x 4 cm defect was performed.  On  pathologic review, there was no gross lesion noted.  However, there was  invasive carcinoma with 2.5 mm depth of stromal invasion, negative for  lymphovascular space involvement and all margins were negative for  carcinoma.  She was seen for a postoperative check on June 22 and the  area was cleaned, fairly well reapproximated.  She comes in today.  She  continues with her smoking cessation and is doing quite well.  She  smokes about 3 to 5 cigarettes per day.  She is very happy with that.  She states that she has occasional zinger-type pain, but no bleeding or  any significant pain of her vulva.   PHYSICAL EXAMINATION:  VITAL  SIGNS:  Weight 158 pounds, blood pressure  140/78.  CONSTITUTIONAL:  Well-developed, well-nourished female in no acute  distress.  PELVIC:  External genitalia is notable for a well-healed vulvar  incision.  There is no visible lesion.  Palpation reveals smooth vulva  with postoperative changes.   ASSESSMENT:  A 55 year old with recurrent vulvar carcinoma who is doing  well postoperatively.   PLAN:  She was congratulated regarding her smoking cessation.  She will  return to see [place] in 3 months for a vulvar check.  She was encouraged to  do the same.      Paola A. Duard Brady, MD  Electronically Signed     PAG/MEDQ  D:  01/08/2009  T:  01/08/2009  Job:  [id number]   cc:   Telford Nab, R.N.  [address]  [place], [place] [address]   [name], M.D.  Fax: [phone]   Noralyn Pick. Eden Emms, MD, [facility]  [address]  [address]  [place]  [place] [address]   Lanora Manis  Dorie Rank, M.D.

## 2010-11-10 NOTE — Consult Note (Signed)
[name], [name]                ACCOUNT NO.:  [id number]   MEDICAL RECORD NO.:  [id number]          PATIENT TYPE:  OUT   LOCATION:  GYN                          FACILITY:  [facility]   PHYSICIAN:  Paola A. Duard Brady, MD    DATE OF BIRTH:  1956/06/23   DATE OF CONSULTATION:  10/31/2007  DATE OF DISCHARGE:                                 CONSULTATION   REFERRING PHYSICIAN:  Dr. Antionette Char.   The patient is seen today in consultation at the request of Dr. Jean Rosenthal-  [name].  [name] is a 55 year old gravida 2, para 2 who about 6  months ago felt a spot on her perineum.  She has began soaking it in  sitz baths, and she says occasionally it would feel like it would clear  up and then return.  It then increased in size, and she has had some  small amount of bleeding that she has noticed when she has voided and  wiped, as well as some pruritus.  There has been no significant change  in her bowel or bladder habits.  She has never felt a mass like this  before and states she has always had normal examinations and normal Pap  smears.  She was seen by Dr. Antionette Char on October 03, 2007.  She  underwent two vulvar biopsies on the left and one vulvar biopsy on the  right.  It appears that all the biopsies were sent and one specimen  container.  There were irregular __________ keratinocytes invading into  the underlying subepithelial tissue, which appears to arise in a  background of squamous cell carcinoma in situ.  The invasive component  is seen in only one specimen, yet three other fragments of tissue have  warty-type squamous cell carcinoma in situ.  The depths of invasion of  the invasive tumor is at least 1-mm.  The invasive component of the in  situ lesions extend to the edges of the biopsy.  There is no  lymphovascular space involvement noted.  It is for this reason that she  is referred to [place] today.  She has a fairly significant history for  shortness of breath.  The patient  smokes a pack per day that she has  done for approximately 37 years.  She gets short of breath when she  walks up the stairs.  She also will experience some chest pain, which  goes up her neck and down her left arm, which she experiences both at  rest and with activity, such as going upstairs.  She will have some  sweating and she will have some sweating, but is not sure if this  sweating is related to the chest pain or if it is related to her hot  flashes.  She states that she was scheduled to have a fairly significant  amount of oral surgery done.  She has related similar issues to the oral  surgeon, who refused to do her surgery until she got cardiac clearance.   PAST MEDICAL HISTORY:  1. TAH-BSO in 2001, for fibroids that was done via an abdominal  vertical skin incision.  2. Teeth removal.   MEDICATIONS:  Hormone replacement therapy patches.   ALLERGIES:  CODEINE WHICH CAUSES NAUSEA.   SOCIAL HISTORY:  She smokes a pack per day and she had done so for 37  years.  She denies use of alcohol.  She is divorced.  She works as a Camera operator.   FAMILY HISTORY:  There is no GYN cancer.   HEALTH MAINTENANCE:  She states she is up-to-date on her mammograms, but  does not remember when the last one was.  She usually gets these done at  Dr. Rhoderick Moody.  She has never had a colonoscopy.   PHYSICAL EXAMINATION:  VITAL SIGNS:  Height 5 feet 3-1/2 inches.  Weight  142 pounds.  Blood pressure 142/82, pulse 66, respirations 18.  GENERAL:  Well-nourished, well-developed female in no acute distress.  NECK:  Supple.  There is no lymphadenopathy, no thyromegaly.  LUNGS:  Clear to auscultation bilaterally.  CARDIOVASCULAR:  Regular rate and rhythm.  ABDOMEN:  Shows a well-healed vertical skin incision.  Abdomen is soft,  nontender and nondistended.  There are no palpable masses or  hepatosplenomegaly.  Groins are negative for adenopathy.  EXTREMITIES:  There is no edema.  PELVIC:   External genitalia is notable for an erosive lesion replacing  the right labia minora in its entirety.  It is not crossing the midline  and in appears to be unilateral.  She has a large hyperkeratotic raised  plaque on the left labia near the posterior fourchette in the perianal  region, measuring approximately 3 cm.  It is thick and it is not  erosive.  She has several other warty hyperkeratotic raised lesions  throughout the posterior fourchette.  Groins are negative for  adenopathy.   ASSESSMENT:  A 55 year old who has vulvar carcinoma.  It appears that  all the vulvar biopsies were sent in one container.  It appears to me  that the invasive component is involving the right labia minora.  I  discussed with the patient that she would need to undergo a modified  radical vulvectomy on the right side, losing her labia minora and  potentially her clitoris.  She would also require a groin node  dissection, superficial node dissection on the right side with a Al Pimple drain postoperatively.  I believe we could laser ablate from the  perianal and perineal lesions that she has and then perform a wide local  excision of the left lesion as well.  If the he left lesion on final  pathology reveals an invasive component, she will need to have her left  groin done at a separate sitting.  She understands that she is going to  need preoperative cardiac clearance.  She will contact one of her family  members physician's to get this scheduled.  She was instructed to take  the responsibility for this and to call us by the end of the week,  either with the name of the physician she will see or if she cannot  locate and get an appointment with a physician that she will contact me,  and I will get her in at [facility] with internal medicine for cardiac  clearance and we will schedule her surgery.  She would be happy to go to  [facility] for surgery if need be.  The risks and benefits of surgery   were discussed with the patient and she wishes to proceed.  Again, the  patient will  take the responsibility of scheduling a visit with an  internal medicine physician.  If she cannot due to insurance issues or  offices being closed to new patients, she was instructed to call Telford Nab or me at [facility] and we will get her scheduled at [facility].  Again, the patient will take this responsibility.      Paola A. Duard Brady, MD  Electronically Signed     PAG/MEDQ  D:  10/31/2007  T:  10/31/2007  Job:  [id number]   cc:   [name], M.D.  Fax: [phone]   Telford Nab, R.N.  [address]  [place], [place] [address]

## 2010-11-10 NOTE — Consult Note (Signed)
NAMESONNY, POTH                ACCOUNT NO.:  [id number]   MEDICAL RECORD NO.:  [id number]          PATIENT TYPE:  OUT   LOCATION:  GYN                          FACILITY:  [facility]   PHYSICIAN:  Paola A. Duard Brady, MD    DATE OF BIRTH:  [birth year]   DATE OF CONSULTATION:  06/12/2008  DATE OF DISCHARGE:                                 CONSULTATION   The patient is a 55 year old with stage I carcinoma multifocal VIN III.  I last saw her in September 2009.  At that time she had appeared to have  some condylomatous changes on the inferior aspect of the right labia  majora ant 2 scattered areas measuring about a centimeter in a small  area of hyperkeratosis on the left medial aspect of the left side  incision.  The patient was shown the lesions with a mirror and  instructed on the use of Aldara cream which she reiterated.  She comes  in today for followup.  At that time she was also given a prescription  for Chantix and counseled regarding smoking cessation.  The prescription  and the need to quit smoking, and setting of quit date as well as  medication directions were all discussed with the patient.  Again at  that time she voiced understanding.  She comes in today for followup.  She has not really been compliant with the Aldara.  She states that at  most she uses it about 1 day a week.  She complained of some burning and  stinging when she uses it.  She states she did the best she could.  She also never started the Chantix stating she did not understand the  directions.  However, the patient did not follow up with [place] or pharmacy  with regards to the directions.  She states she is smoking about 10-15  cigarettes per day which might be a small decline in her usual cigarette  use.  She otherwise denies any complaints.  She does have a cough which  is chronic.   PHYSICAL EXAMINATION:  VITAL SIGNS:  Weight 150 pounds.  CONSTITUTIONAL:  Well-nourished, well-developed female in no acute  distress.  GENITOURINARY:  Groin incisions are expected.  On the right groin shows  well-healed surgical incision.  There is no lymphadenopathy.  Drain site  is well healed. Left groin is negative.  The vulva is distorted post  surgery and complete separation of surgical incisions.  On the patient's  right side, there were 2 hyperkeratotic raised lesions involving the  labia majora each measuring about 0.5 cm which are new.  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  No evidence of ulceration or cancer.  Similarly in the posterior fourchette towards the patient's left side,  there is an increasing hyperkeratotic area, again consistent with  dysplasia.  The palpation of the vulva reveals no discrete masses.  These areas are smooth.   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  Once again I showed her 2 times her  vulva with the mirror.  She voiced understanding of the directions and  location of her lesions, to apply the Aldara.  She knows it is very  important to use it 3 days a week.  I did discuss with her that she did  not feel that she could or would use the Aldara as instructed, that we  would be more than happy to proceed with and ablative or excisional  procedure to help her.  If we were to do that, I would repeat biopsies  and proceed with CO2 ablation.  She did very poorly with the wide local  excisions.  We also had a lengthy discussion regarding her Chantix and  her smoking cessation.  Again, she voiced understanding of the  directions.  She understands that if in a few days or weeks she feels  that she is not  able to be compliant, that it is her responsibility to call us so we can  get her on a different  treatment regimen as the lesions appear to be  getting worse.  If she does not have improvement when she comes back to  see [place] in 12 weeks, will repeat biopsies and schedule her for the OR for  ablated therapy.      Paola A. Duard Brady, MD  Electronically Signed     PAG/MEDQ  D:  06/12/2008  T:  06/13/2008  Job:  [id number]   cc:   Telford Nab, R.N.  [address]  [place], [place] [address]   [name], M.D.  Fax: [phone]   [facility]  Fax: [phone]   Noralyn Pick. Eden Emms, MD, [facility]  [address]  [address]  [place]  [place] [address]

## 2010-11-10 NOTE — Op Note (Signed)
[name], [name]                ACCOUNT NO.:  [id number]   MEDICAL RECORD NO.:  [id number]          PATIENT TYPE:  AMB   LOCATION:  DAY                          FACILITY:  [facility]   PHYSICIAN:  Paola A. Duard Brady, MD    DATE OF BIRTH:  03-30-1956   DATE OF PROCEDURE:  10/22/2008  DATE OF DISCHARGE:                               OPERATIVE REPORT   PREOPERATIVE DIAGNOSIS:  VIN III.   POSTOPERATIVE DIAGNOSIS:  VIN III.   PROCEDURE:  Wide local excision of the vulva.   SURGEONS:  1. Dr. Duard Brady.  2. Dr. Tamela Oddi.   ANESTHESIA:  General.   ESTIMATED BLOOD LOSS:  Minimal.   IV FLUIDS:  700 mL.   URINE OUTPUT:  75 mL.   COMPLICATIONS:  None.   PATHOLOGY:  Included wide local excision of the vulva marked on a cork  board for pathology.   OPERATIVE FINDINGS:  Included a thickened hyperkeratotic raised area  starting at approximately 7 o'clock on the right perineum of the patient  extending across midline involving the posterior fourchette.  The area  in total measure approximately 3 x 1.5 cm.   DESCRIPTION OF PROCEDURE:  The patient was taken to the operating room  and placed in supine position where general anesthesia was induced.  The  perineum was then prepped in the usual fashion.  A Foley catheter was  inserted in the bladder under sterile conditions.  A timeout was  performed to confirm the patient, the procedure, antibiotic and allergy  status.  Antibiotics were not indicated and the patient is ALLERGIC TO  LATEX.  The patient was then draped in the usual fashion.  An elliptical  incision with a 4-mm margin was created surrounding the obvious area of  dysplastic changes.  Acetowhite epithelium was also noted.  This  skinning vulvectomy was performed to encompass the area of the lesion.  Hemostasis obtained using pinpoint cautery.  Using interrupted sutures  of 3-0 Vicryl, the elliptical incision at the perineal body was repaired  primarily.  There was adequate  hemostasis.   The patient tolerated the procedure well and was taken to the recovery  room in stable condition.  All instrument, Ray-Tec and needle counts  were correct x2.      Paola A. Duard Brady, MD  Electronically Signed     PAG/MEDQ  D:  10/22/2008  T:  10/23/2008  Job:  [id number]   cc:   [name], M.D.  Fax: [phone]   Telford Nab, R.N.  [address]  [place], [place] [address]   Noralyn Pick. Eden Emms, MD, [facility]  [address]  [address]  [place]  [place] [address]

## 2010-11-10 NOTE — Consult Note (Signed)
[name], [name]                ACCOUNT NO.:  [id number]   MEDICAL RECORD NO.:  [id number]          PATIENT TYPE:  OUT   LOCATION:  GYN                          FACILITY:  [facility]   PHYSICIAN:  Paola A. Duard Brady, MD    DATE OF BIRTH:  14-May-1956   DATE OF CONSULTATION:  01/30/2008  DATE OF DISCHARGE:                                 CONSULTATION   The patient is a very pleasant 55 year old who on January 02, 2008 underwent  a right superficial groin node dissection, right modified radical  vulvectomy, left wide local excision and CO2 laser of the vulva.  She  has a stage I vulvar carcinoma with negative lymph nodes.  However, she  had complete disruption of all her surgical incisions.  I last saw her  January 16, 2008.  At that time, all incisions were separated and they had  exudate and did not have appearance of adequate care.  Care was re-  reviewed with the patient and she comes in today for follow-up.  She is  overall doing quite well.  She states that she thinks the discharge has  decreased.  She does have some occasional shooting pains in the vagina,  some discomfort in the right groin, but otherwise doing well.  She is  doing sitz baths per her report 4 to 5 times a day.   PHYSICAL EXAMINATION:  Weight 140 pounds, blood pressure 106/63, well-  nourished, well-developed alert female in no acute distress.  Groin is  well healed.  She has a well healed surgical incision.  The vulva is  visualized.  The left incision has almost completely healed.  There is  only a thin superficial excoriation of the left incision.  The laser  ablated area has completely healed.  Within the right vulva, there is  approximately 50% closure.  There is no exudate.  There has been a  dramatic improvement.   ASSESSMENT:  A 55 year old with stage I vulvar carcinoma who has a  dramatic improvement in her surgical incisions with appropriate peri-  care.   PLAN:  She will continue her care as described above.   She returns to me  in 2 weeks for follow-up.      Paola A. Duard Brady, MD  Electronically Signed     PAG/MEDQ  D:  01/30/2008  T:  01/30/2008  Job:  [id number]   cc:   Telford Nab, R.N.  [address]  [place], [place] [address]   [name], M.D.  Fax: [phone]   Marcene Duos, M.D.   Noralyn Pick. Eden Emms, MD, [facility]  [address]  [address]  [place]  [place] [address]

## 2010-11-10 NOTE — Consult Note (Signed)
NAMECALIANNE, [name]                ACCOUNT NO.:  [id number]   MEDICAL RECORD NO.:  [id number]          PATIENT TYPE:  OUT   LOCATION:  GYN                          FACILITY:  [facility]   PHYSICIAN:  John T. Kyla Balzarine, M.D.    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  Surgery was  in July 2009 and she had superficial separation of all her surgical  incisions.  On follow up examinations, she had condylomatous changes on  the inferior aspect of the right labium majus.  She was counseled to  quit smoking and instructed in use of Aldara.  She returned for followup  in December and, in discussion, she continues to have used it  irregularly, varying between one and 3 times weekly.  When she is at 3  times a week, she does relate vulvar burning.  She never initiated  Chantix and continues to smoke 10 cigarettes daily.  She denies pain or  bleeding.   PAST MEDICAL HISTORY:  TAH/BSO in 2001 for fibroids and full mouth tooth  extraction.   MEDICATIONS:  Currently include Aldara and estrogen patch.   ALLERGIES:  CODEINE causes nausea.   PERSONAL AND SOCIAL HISTORY:  The patient has a 40 pack-year history of  tobacco use and states currently smoking 10 cigarettes a day.  Denies  ethanol or illicit drug use.   FAMILY HISTORY:  No gynecologic malignancies.   REVIEW OF SYSTEMS:  Essentially negative 10-point system review.   EXAM:  VITAL SIGNS:  Stable and afebrile as recorded in [employer].  LYMPHATICS:  Negative for pathologic lymphadenopathy.  Pain score zero.  CONSTITUTIONAL:  The patient is anxious, alert and oriented x3 in no  acute distress.  ABDOMEN:  Soft and benign and no tenderness or mass.  EXTREMITIES:  Full strength and range of motion without edema, cords or  Homan's.  The right groin incision is well healed and there is no  underlying lymphocyst.  PELVIC:  Inspection of the vulva reveals heaped up thickened leukoplakic  epithelium at approximately 7 o'clock on the introitus adjacent to the  vaginal introitus.  This extends to the mid perineum.  That the vagina  is otherwise clear.  On bimanual and rectovaginal examinations, no  underlying nodularity.  Absent uterus and cervix and there is no mass,  tenderness or nodularity.   PROCEDURE NOTE:  After informed consent, I infiltrated the  hyperkeratotic epithelium with 1% Xylocaine, obtained a biopsy with a  cervical punch biopsy have treated with silver nitrate for hemostasis.  The patient tolerated procedure well.   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  The patient  has been very noncompliant in terms of her use of Aldara.   PLAN:  Disposition will depend upon the results of biopsy.  She should  return for followup in 3 months to complete a year of followup for  vulvar cancer if the biopsies are negative.  If positive for high-grade  VIN, she will need additional laser vaporization and if there is any  suspicion of invasion, would require a repeat resection.      John T. Kyla Balzarine, M.D.  Electronically Signed     JTS/MEDQ  D:  09/25/2008  T:  09/25/2008  Job:  [id number]   cc:   Telford Nab, R.N.  [address]  [place], [place] [address]   [name], M.D.  Fax: [phone]   Noralyn Pick. Eden Emms, MD, [facility]  [address]  [address]  [place]  [place] [address]   Melvern Banker  Fax: [phone]

## 2010-11-10 NOTE — Consult Note (Signed)
[name], [name]                ACCOUNT NO.:  [id number]   MEDICAL RECORD NO.:  [id number]          PATIENT TYPE:  OUT   LOCATION:  GYN                          FACILITY:  [facility]   PHYSICIAN:  Paola A. Duard Brady, MD    DATE OF BIRTH:  1956/03/05   DATE OF CONSULTATION:  03/19/2008  DATE OF DISCHARGE:                                 CONSULTATION   The patient comes in today for follow up.  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  Her  surgery was on January 02, 2008.  She suffered superficial separation of all  her surgical incisions.  She comes in today for follow up.  She has  complete healing of all the incisions.  She does appear to have  persistent condylomatous changes on the inferior aspect of the right  labia majora with 2 scattered areas measuring approximately a  centimeter.  There is also a very small amount of hyperkeratosis on the  left medial aspect of the left incision.  The patient was shown these  lesions with a mirror.   ASSESSMENT:  The patient with stage I carcinoma and multifocal VIN III.  Considering the difficulty she had with postoperative wound separation  and healing, will proceed with Aldara.  She was given a prescription and  directions for using Aldara.  She was shown her lesions with a mirror  today and voiced understanding.  We also discussed smoking cessation.  She wished to have a prescription for Chantix.  Instructions for  Chantix, a prescription and the need to quit smoking and setting a quit  date were all discussed with the patient.  Again, she voiced  understanding.  She will return to see me in 3 months.      Paola A. Duard Brady, MD  Electronically Signed     PAG/MEDQ  D:  03/19/2008  T:  03/20/2008  Job:  [id number]   cc:   Telford Nab, R.N.  [address]  [place], [place] [address]   [name], M.D.  Fax: [phone]   [facility]  Fax [phone]   Noralyn Pick. Eden Emms, MD, [facility]  [address]  [address]  [place]  [place] [address]

## 2010-11-10 NOTE — Assessment & Plan Note (Signed)
[name]                            CARDIOLOGY OFFICE NOTE   [name], [name]                       MRN:          [id number]  DATE:11/27/2007                            DOB:          [birth year]    The patient is a 51-year patient referred for preop clearance, abnormal  EKG.  Coronary risk factors include smoking.  She is nondiabetic,  nonhypertensive.  Cholesterol status is unknown.  Family history is  negative for premature coronary disease.  The patient has vulvar cancer.  Apparently, she has had a mass on her vulva for about 6 months that  needs to be excised.  I believe this will be under general anesthesia   The patient had an abnormal EKG.  I reviewed this.  She has sinus  rhythm, with nonspecific ST-T wave changes.  Intervals are fine.  There  is no heart block, and no previous MI.   The patient does get occasional chest pain.  It can be exertional,  particularly at work when she lifts things.  It can also be somewhat  exertional.  The chest pain has been happening for about 3-4 months.  It  is not clearly anginal.  There is no associated dyspnea, pleuritic pain,  no palpitations, diaphoresis.  The pain is primarily in the center of  her chest.  It can go out towards the shoulders, but not down the arms.  She has not tried any nitroglycerin for it, and in general the pains are  self-limited, lasting about 10 minutes.  They come and go.  They do not  occur every day, but she does get them every week.  Her review of  systems is otherwise negative.   PAST MEDICAL HISTORY:  1. Fairly benign.  2. She has history of a hysterectomy.  3. History of some poor dentition, with upper dentures.   Her only medications include aspirin and a transdermal estrogen patch.   The patient is allergic to CODEINE.   She is divorced.  She has two children.  Her youngest 48 year old  daughter lives with her with a grandchild.  She likes to swim and read.  Unfortunately, she has been smoking since the age of 41, about a pack a  day.  She does not drink.   FAMILY HISTORY:  Remarkable for mother still being alive.  Father was  killed; it sounds like it was a traumatic incident.   The patient works as a Land at [employer].  She  has been doing this for about 6 months.   REVIEW OF SYSTEMS:  Otherwise remarkable for some fatigue, arthritis,  and some allergies.   PHYSICAL EXAMINATION:  GENERAL:  Remarkable for a middle-aged black  female in no distress.  Affect is appropriate.  VITAL SIGNS:  Blood pressure 140/80, weight 145, respiratory rate 14,  pulse 74 and regular.  HEENT:  Unremarkable.  NECK:  Carotids normal, without bruit.  No lymphadenopathy, thyromegaly,  or JVP elevation.  LUNGS:  Clear.  Good diaphragmatic motion.  No wheezing.  HEART:  S1, S2 normal heart sounds.  PMI normal.  ABDOMEN:  Benign.  Bowel sounds positive.  No AAA no bruit, no  hepatosplenomegaly, no hepatojugular reflux.  EXTREMITIES:  Distal pulses are intact, no edema.  NEUROLOGIC:  Nonfocal.  SKIN:  Warm and dry.  MUSCULOSKELETAL:  No muscular weakness.   EKG:  Shows sinus rhythm, nonspecific ST-T wave changes.   IMPRESSION:  1. Chest pain, nonspecific ST-T wave changes on electrocardiogram in a      smoker.  Follow-up adenosine Myoview.  2. Smoking cessation.  I spent less than 10 minutes talking to the      patient.  I think that even if she stopped smoking for 1 week this      will improve her chances of not having any complications with      intubation.  I encouraged her to follow up with Dr. Delrae Alfred      regarding starting Wellbutrin postoperatively for smoking      cessation.  The risks in terms of lung cancer were discussed at      length with the patient and also the risk of increasing      cardiovascular disease.  This may be hard for her to break since      she done it since she was a teenager.  3. Poor dentition.  No  murmur.  Follow up with a dentist.  Limited      risk for bacteremia.  4. Preop clearance.  So long as the patient's stress test is normally,      she would appear to be a reasonable risk.  5. Vulvar cancer.  Follow up with Dr. Duard Brady.  Sounds like local      excision is indicated.   So long as the patient's stress test is normal, she will be cleared for  surgery.     Noralyn Pick. Eden Emms, MD, [facility]  Electronically Signed    PCN/MedQ  DD: 11/27/2007  DT: 11/27/2007  Job #: [id number]   cc:   Marcene Duos, M.D.  Paola A. Duard Brady, MD

## 2010-11-10 NOTE — Consult Note (Signed)
[name], [name]                ACCOUNT NO.:  [id number]   MEDICAL RECORD NO.:  [id number]          PATIENT TYPE:  OUT   LOCATION:  GYN                          FACILITY:  [facility]   PHYSICIAN:  Paola A. Duard Brady, MD    DATE OF BIRTH:  1956-01-25   DATE OF CONSULTATION:  01/16/2008  DATE OF DISCHARGE:                                 CONSULTATION   [name] is a very pleasant 55 year old with vulvar carcinoma who,  on January 02, 2008, underwent right superficial groin node dissection,  right modified radical vulvectomy, left wide local excision and CO2  laser of the vulva.  Operative findings included a 2 cm erosive lesion  encompassing the right labia minora.  She had a 5 x 5 cm plaque with  hyperkeratosis on the left gluteus/labia majora on her left side and  hyperkeratotic changes along the perineal body.  Final pathology  revealed a 0/6 lymph nodes being involved.  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  Her  drain has been removed as minimal output.  She was seen by an Telford Nab, R.N., on January 11, 2008, at which time the vulvar incision was  intact, laser area was granulating, the lateral buttocks incision was  intact but pulling.  She comes in today for follow-up.  It was  recommended that she do sitz baths fairly frequently and continue her  Peri-Care.  She comes in today.  She is overall doing quite well, states  her pain is improving but she is still having some pain.   PHYSICAL EXAMINATION:  VITAL SIGNS:  Weight 142 pounds, blood pressure  108/78.  GENERAL APPEARANCE:  Well-nourished, well-developed female in no acute  distress.  Right groin is completely healed.  The drain has been removed  and the  drain site is looking well, a Band-Aid was applied.  PELVIC:  External genitalia is notable for surgical excisions.  The  wound have completely separated.  There is evidence of exudate.  It does  not appear that they have been maintained adequately enough.  The right  incision is completely opened which was closed last week.  The left  incision is open completely.  The laser areas granulating in well.   ASSESSMENT:  A 55 year old with stage 1 vulvar carcinoma whose incisions  have opened up.   PLAN:  She was encouraged to increase the frequency of her sitz baths  and to improve her Peri-Care.  She can use Silvadene p.r.n.  She will  continue to do this and return to see [place] in 2 weeks.  She will contact  us if the symptoms increased or she feels that the vulva is not healing  well.      Paola A. Duard Brady, MD  Electronically Signed     PAG/MEDQ  D:  01/16/2008  T:  01/16/2008  Job:  [id number]   cc:   Telford Nab, R.N.  [address]  [place], [place] [address]   [name], M.D.  Fax: [phone]   Marcene Duos, M.D.   Noralyn Pick. Eden Emms, MD, [facility]  [address]  [address]  [place]  [place] [address]

## 2010-11-10 NOTE — Op Note (Signed)
NAMEBRIZA, [name]                ACCOUNT NO.:  [id number]   MEDICAL RECORD NO.:  [id number]          PATIENT TYPE:  OIB   LOCATION:  1535                         FACILITY:  [facility]   PHYSICIAN:  Paola A. Duard Brady, MD    DATE OF BIRTH:  May 29, 1956   DATE OF PROCEDURE:  12/03/2008  DATE OF DISCHARGE:                               OPERATIVE REPORT   PREOPERATIVE DIAGNOSIS:  Recurrent vulvar carcinoma.   POSTOPERATIVE DIAGNOSIS:  Recurrent vulvar carcinoma.   PROCEDURE:  Radical wide local excision of the vulva.   SURGEONS:  [name], M.D.  Paola A. Duard Brady, MD   ANESTHESIA:  General.   ESTIMATED BLOOD LOSS:  Minimal.   IV FLUIDS:  750 mL.   URINE OUTPUT:  200 mL.   SPECIMENS:  Vulva, which was corked and sent to pathology.   OPERATIVE FINDINGS:  Include a small amount of acetowhite hyperkeratotic  areas, at approximately 7 o'clock on the posterior fourchette.  Otherwise, there was no visible lesion and no obvious cancer.   The patient was taken to the operating room, placed in a supine position  where general anesthesia was induced.  She was then placed in a dorsal  lithotomy position.  She was then draped in the usual sterile fashion.  Time-out was performed to confirm the patient, the procedure, antibiotic  and allergy status -- including codeine and latex.  At this time we had  latex gloves that had not touched the patient, therefore gloves were  changed.  The initial preps had been latex-free.  After reconfirming  that there was nothing that had been touched by latex, 5 mL of 0.25%  Marcaine was injected into the posterior fourchette.  An incision was  made from the inferior aspect of the labia minora on the right.  The  patient's right side down to the anal verge, across the perineal body  midline to the anal verge at approximately 3 o'clock, back up to the  level of the hymenal ring.  We then continued this incision on the  superior aspect across midline at  the introitus.  The skin edges were  grasped with Allis clamps.  The surgeon's finger was placed into the  rectum.  To the level of the rectal endopelvic fascia,  the vulva was  dissected free.  The entire time the surgeon's finger was inside the  rectum to ensure integrity of the rectal musculature.  The transverse  muscles were visualized and there was no mucosal involvement.  The  lesion and wide local excision was performed using cautery as necessary.  The specimen was completely amputated and was handed off to the scrub  nurse for corking, for orientation on pathology.   At this point, we had approximately a 6 x 5 x 4 cm defect.  The  endopelvic fascia overlying the rectum was then reapproximated with  interrupted sutures of 3-0 Vicryl.  The deep layers of tissue were  brought together with 3-0 Vicryl.  We then reapproximated the skin at  the anal verge with 3-0 Vicryls, and the incision was closed with 3-0  Vicryls after irrigation.  There was adequate hemostasis.  Rectal  examination performed to confirm that the rectal mucosa was intact and  there was good rectal integrity.   The patient tolerated the procedure well; was taken to recovery room in  stable condition.  All instrument, needle and Ray-Tec counts were  correct x2.      Paola A. Duard Brady, MD  Electronically Signed     PAG/MEDQ  D:  12/03/2008  T:  12/04/2008  Job:  [id number]   cc:   [name], M.D.  Fax: [phone]   Telford Nab, R.N.  [address]  [place], [place] [address]   Noralyn Pick. Eden Emms, MD, [facility]  [address]  [address]  [place]  [place] [address]   Marcene Duos, M.D.

## 2010-11-13 NOTE — Op Note (Signed)
[facility]  Patient:    [name], [name]                       MRN: [id number] Proc. Date: 12/10/99 Adm. 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  POSTOPERATIVE DIAGNOSIS:  OPERATION:  SURGEON:                      Kathreen Cosier, M.D.  ASSISTANT:                    Deniece Ree, M.D.  ANESTHESIA:                   General anesthesia.  ESTIMATED BLOOD LOSS:  DESCRIPTION OF PROCEDURE:     The patient was placed on the operating table in he supine position and general anesthesia was administered.  The abdomen was prepped and draped.  The bladder was emptied with a Foley catheter.  A midline subumbilical incision was made and carried down to the rectus fascia.  The fascia was cleaned and incised the length of the incision.  The rectus muscles were retracted laterally and the peritoneum incised the length of the incision.  The uterus was 20 weeks size with multiple myomas.  The ovaries were normal.  The right round ligament was grasped with a Kelly clamp, cut, and suture ligated with #1 chromic. The procedure was done in a similar fashion on the other side.  The right infundibulopelvic ligament was grasped with a Kelly, cut, and suture ligated with #1 chromic.  The procedure was done in a similar fashion on the other side. The uterine vessels were skeletonized bilaterally, doubly clamped with Heaney clamps on the right, cut, and suture ligated with #1 chromic x 2.  The procedure was done in a similar fashion on the other side.  Cardinal and uterosacral ligaments were grasped with a straight Kocher clamp on the right, cut, and suture ligated with #1 chromic.  The procedure done in a similar fashion on the other side.  Specimen consisting of uterus, tubes, and ovaries removed at the cervicovaginal junction  with Mayo  scissors.  The vaginal vault was run with an interlocking suture of #1 chromic and the vault left open.  Hemostasis was satisfactory.  Estimated blood  loss was 200 cc.  The operative site was reperitonealized with 2-0 chromic. The abdomen was closed in layers.  The peritoneum with continuous suture of 0 chromic, the fascia with interrupted suture of 0 Dexon, and the skin closed with staples. DD:  12/10/99 TD:  12/13/99 Job: [id number]

## 2010-11-13 NOTE — Discharge Summary (Signed)
[facility]  Patient:    [name], [name]                       MRN: [id number] Adm. Date:  [id number] Disc. Date: [id number] Attending:  Venita Sheffield                           Discharge Summary  HISTORY:                      The patient is a 55 year old female with an 18 week sized myoma who was admitted for TAH/BSO.  On admission, her sodium was 142, potassium 3.9, chloride 105, glucose 67, creatinine 0.7, bilirubin normal.  Hemoglobin was 13.1, white count 7.2.  Urinalysis was negative.  On June 14, she underwent TAH/BSO and did well.  Postoperatively, she did well and was discharged to home on the third postoperative day on Tylenol #3, one q.4h. p.r.n. and Premarin 0.625 q.d.   Hemoglobin was 12.1.  DISCHARGE DIAGNOSIS:          Status post total abdominal hysterectomy and bilateral salpingo-oophorectomy for myoma uteri. DD:  12/13/99 TD:  12/15/99 Job: [id number]

## 2011-03-25 LAB — COMPREHENSIVE METABOLIC PANEL WITH GFR
ALT: 9
AST: 17
Albumin: 3.6
Alkaline Phosphatase: 64
BUN: 10
CO2: 28
Calcium: 9.3
Chloride: 106
Creatinine, Ser: 0.69
GFR calc non Af Amer: >60
Glucose, Bld: 103 — ABNORMAL HIGH
Potassium: 3.5
Sodium: 141
Total Bilirubin: 0.7
Total Protein: 6.7

## 2011-03-25 LAB — DIFFERENTIAL
Basophils Absolute: 0.1
Basophils Relative: 1
Eosinophils Absolute: 0.1
Eosinophils Relative: 1
Lymphocytes Relative: 33
Lymphs Abs: 2.9
Monocytes Absolute: 0.8
Monocytes Relative: 9
Neutro Abs: 4.8
Neutrophils Relative %: 55

## 2011-03-25 LAB — CBC
HCT: 36.3
Hemoglobin: 12.6
RBC: 3.86 — ABNORMAL LOW
WBC: 8.7

## 2011-03-25 LAB — TYPE AND SCREEN
ABO/RH(D): O POS
Antibody Screen: NEGATIVE

## 2011-03-25 LAB — ABO/RH: ABO/RH(D): O POS

## 2011-04-30 ENCOUNTER — Other Ambulatory Visit (HOSPITAL_COMMUNITY): Payer: Self-pay | Admitting: Gastroenterology

## 2011-04-30 DIAGNOSIS — R1084 Generalized abdominal pain: Secondary | ICD-10-CM

## 2011-05-07 ENCOUNTER — Ambulatory Visit (HOSPITAL_COMMUNITY)
Admission: RE | Admit: 2011-05-07 | Discharge: 2011-05-07 | Disposition: A | Discharge status: Home / Self Care | Payer: Self-pay | Source: Ambulatory Visit | Attending: Gastroenterology | Admitting: Gastroenterology

## 2011-05-07 DIAGNOSIS — R1084 Generalized abdominal pain: Secondary | ICD-10-CM

## 2011-05-10 ENCOUNTER — Other Ambulatory Visit (HOSPITAL_COMMUNITY): Payer: Self-pay | Admitting: Gastroenterology

## 2011-05-13 ENCOUNTER — Ambulatory Visit (HOSPITAL_COMMUNITY)
Admission: RE | Admit: 2011-05-13 | Discharge: 2011-05-13 | Disposition: A | Discharge status: Home / Self Care | Payer: Self-pay | Source: Ambulatory Visit | Attending: Gastroenterology | Admitting: Gastroenterology

## 2011-05-13 DIAGNOSIS — R1110 Vomiting, unspecified: Secondary | ICD-10-CM | POA: Insufficient documentation

## 2011-05-13 DIAGNOSIS — R1084 Generalized abdominal pain: Secondary | ICD-10-CM | POA: Insufficient documentation

## 2011-05-13 DIAGNOSIS — D126 Benign neoplasm of colon, unspecified: Secondary | ICD-10-CM | POA: Insufficient documentation

## 2011-05-14 ENCOUNTER — Other Ambulatory Visit (HOSPITAL_COMMUNITY): Payer: Self-pay

## 2011-05-18 ENCOUNTER — Other Ambulatory Visit (HOSPITAL_COMMUNITY): Payer: Self-pay

## 2011-06-25 ENCOUNTER — Other Ambulatory Visit (HOSPITAL_COMMUNITY): Payer: Self-pay

## 2011-06-25 ENCOUNTER — Ambulatory Visit (HOSPITAL_COMMUNITY)
Admission: RE | Admit: 2011-06-25 | Discharge: 2011-06-25 | Disposition: A | Discharge status: Home / Self Care | Payer: Self-pay | Source: Ambulatory Visit | Attending: Gastroenterology | Admitting: Gastroenterology

## 2011-06-25 DIAGNOSIS — K449 Diaphragmatic hernia without obstruction or gangrene: Secondary | ICD-10-CM | POA: Insufficient documentation

## 2011-06-25 DIAGNOSIS — R109 Unspecified abdominal pain: Secondary | ICD-10-CM | POA: Insufficient documentation

## 2011-06-25 DIAGNOSIS — N281 Cyst of kidney, acquired: Secondary | ICD-10-CM | POA: Insufficient documentation

## 2011-06-25 DIAGNOSIS — R1084 Generalized abdominal pain: Secondary | ICD-10-CM

## 2011-08-02 ENCOUNTER — Encounter (HOSPITAL_COMMUNITY): Payer: Self-pay

## 2011-08-03 ENCOUNTER — Other Ambulatory Visit: Payer: Self-pay | Admitting: Gastroenterology

## 2011-08-03 ENCOUNTER — Encounter (HOSPITAL_COMMUNITY)
Admission: RE | Disposition: A | Discharge status: Home / Self Care | Payer: Self-pay | Source: Ambulatory Visit | Attending: Gastroenterology

## 2011-08-03 ENCOUNTER — Ambulatory Visit (HOSPITAL_COMMUNITY)
Admission: RE | Admit: 2011-08-03 | Discharge: 2011-08-03 | Disposition: A | Discharge status: Home / Self Care | Payer: Self-pay | Source: Ambulatory Visit | Attending: Gastroenterology | Admitting: Gastroenterology

## 2011-08-03 ENCOUNTER — Encounter (HOSPITAL_COMMUNITY): Payer: Self-pay | Admitting: *Deleted

## 2011-08-03 DIAGNOSIS — D126 Benign neoplasm of colon, unspecified: Secondary | ICD-10-CM | POA: Insufficient documentation

## 2011-08-03 HISTORY — DX: Nausea with vomiting, unspecified: R11.2

## 2011-08-03 HISTORY — DX: Adverse effect of unspecified anesthetic, initial encounter: T41.45XA

## 2011-08-03 HISTORY — DX: Gastro-esophageal reflux disease without esophagitis: K21.9

## 2011-08-03 HISTORY — DX: Depression, unspecified: F32.A

## 2011-08-03 HISTORY — DX: Major depressive disorder, single episode, unspecified: F32.9

## 2011-08-03 HISTORY — DX: Other specified postprocedural states: Z98.890

## 2011-08-03 HISTORY — DX: Other complications of anesthesia, initial encounter: T88.59XA

## 2011-08-03 HISTORY — PX: COLONOSCOPY: SHX5424

## 2011-08-03 SURGERY — COLONOSCOPY
Anesthesia: Moderate Sedation

## 2011-08-03 MED ORDER — FENTANYL NICU IV SYRINGE 50 MCG/ML
INJECTION | INTRAMUSCULAR | Status: DC | PRN
  Administered 2011-08-03 (×4): 25 ug via INTRAVENOUS

## 2011-08-03 MED ORDER — MIDAZOLAM HCL 10 MG/2ML IJ SOLN
INTRAMUSCULAR | Status: DC | PRN
  Administered 2011-08-03 (×3): 2 mg via INTRAVENOUS

## 2011-08-03 MED ORDER — MIDAZOLAM HCL 10 MG/2ML IJ SOLN
INTRAMUSCULAR | Status: AC
  Filled 2011-08-03: qty 4

## 2011-08-03 MED ORDER — FENTANYL CITRATE 0.05 MG/ML IJ SOLN
INTRAMUSCULAR | Status: AC
  Filled 2011-08-03: qty 4

## 2011-08-03 MED ORDER — SODIUM CHLORIDE 0.9 % IV SOLN
Freq: Once | INTRAVENOUS | Status: AC
  Administered 2011-08-03: 500 mL via INTRAVENOUS

## 2011-08-03 MED ORDER — DIPHENHYDRAMINE HCL 50 MG/ML IJ SOLN
INTRAMUSCULAR | Status: AC
  Filled 2011-08-03: qty 1

## 2011-08-03 NOTE — Op Note (Signed)
[facility] [address] [place], [place]  [address]  COLONOSCOPY PROCEDURE REPORT  PATIENT:  [name], [name]  MR#:  [id number] BIRTHDATE:  Jul 18, 1955, 55 yrs. old  GENDER:  female ENDOSCOPIST:  Wandalee Ferdinand, MD REF. BY: PROCEDURE DATE:  08/03/2011 PROCEDURE: Colonoscopy with polypectomy and biopsy ASA CLASS: 2 INDICATIONS: Polyp seen on barium enema MEDICATIONS:  Fentanyl 100 mcg IV, Versed 6 mg IV  DESCRIPTION OF PROCEDURE:   After the risks benefits and alternatives of the procedure were thoroughly explained, informed consent was obtained.  The Pentax Colonoscope [id number] endoscope was introduced through the anus and advanced to the cecum , without limitations.  The quality of the prep was good .  The instrument was then slowly withdrawn as the colon was fully examined. <<PROCEDUREIMAGES>>  FINDINGS: #1 small 3-4 mm polyp at the hepatic flexure removed with cold biopsy forceps  #2  8 mm pedunculated polyp in the descending colon removed by snare cautery technique  COMPLICATIONS: None  IMPRESSION: 2 colon polyps as described above  RECOMMENDATIONS: Check pathology  ______________________________ Wandalee Ferdinand, MD  CC:  n. eSIGNEDWandalee Ferdinand at 08/03/2011 10:42 AM  Verdis Prime, [id number]

## 2011-08-04 ENCOUNTER — Encounter (HOSPITAL_COMMUNITY): Payer: Self-pay | Admitting: Gastroenterology

## 2011-12-16 ENCOUNTER — Other Ambulatory Visit (HOSPITAL_COMMUNITY)
Admission: RE | Admit: 2011-12-16 | Discharge: 2011-12-16 | Disposition: A | Discharge status: Home / Self Care | Payer: Medicaid Other | Source: Ambulatory Visit | Attending: Gynecologic Oncology | Admitting: Gynecologic Oncology

## 2011-12-16 ENCOUNTER — Encounter: Payer: Self-pay | Admitting: Gynecologic Oncology

## 2011-12-16 ENCOUNTER — Ambulatory Visit: Payer: Self-pay | Attending: Gynecologic Oncology | Admitting: Gynecologic Oncology

## 2011-12-16 VITALS — BP 120/70 | HR 74 | Temp 99.0°F | Resp 18 | Ht 64.33 in | Wt 133.9 lb

## 2011-12-16 DIAGNOSIS — F329 Major depressive disorder, single episode, unspecified: Secondary | ICD-10-CM | POA: Insufficient documentation

## 2011-12-16 DIAGNOSIS — Z8544 Personal history of malignant neoplasm of other female genital organs: Secondary | ICD-10-CM | POA: Insufficient documentation

## 2011-12-16 DIAGNOSIS — F17200 Nicotine dependence, unspecified, uncomplicated: Secondary | ICD-10-CM | POA: Insufficient documentation

## 2011-12-16 DIAGNOSIS — Z860100 Personal history of colon polyps, unspecified: Secondary | ICD-10-CM | POA: Insufficient documentation

## 2011-12-16 DIAGNOSIS — Z01419 Encounter for gynecological examination (general) (routine) without abnormal findings: Secondary | ICD-10-CM | POA: Insufficient documentation

## 2011-12-16 DIAGNOSIS — Z90710 Acquired absence of both cervix and uterus: Secondary | ICD-10-CM | POA: Insufficient documentation

## 2011-12-16 DIAGNOSIS — F3289 Other specified depressive episodes: Secondary | ICD-10-CM | POA: Insufficient documentation

## 2011-12-16 DIAGNOSIS — Z888 Allergy status to other drugs, medicaments and biological substances status: Secondary | ICD-10-CM | POA: Insufficient documentation

## 2011-12-16 DIAGNOSIS — C519 Malignant neoplasm of vulva, unspecified: Secondary | ICD-10-CM | POA: Diagnosis not present

## 2011-12-16 DIAGNOSIS — Z7982 Long term (current) use of aspirin: Secondary | ICD-10-CM | POA: Insufficient documentation

## 2011-12-16 DIAGNOSIS — K219 Gastro-esophageal reflux disease without esophagitis: Secondary | ICD-10-CM | POA: Insufficient documentation

## 2011-12-16 DIAGNOSIS — Z124 Encounter for screening for malignant neoplasm of cervix: Secondary | ICD-10-CM | POA: Insufficient documentation

## 2011-12-16 NOTE — Patient Instructions (Addendum)
Follow up with Dr. Liston Alba in 6 months.

## 2011-12-16 NOTE — Progress Notes (Signed)
Consult Note: Gyn-Onc  [name] 56 y.o. female  CC:  Chief Complaint  Patient presents with  . Vulvar Cancer    Follow up    HPI: The patient is a 56 year old with multifocal vulvar carcinoma. She initially underwent a radical right vulvectomy and left-sided wide local excision with CO2 laser ablation, July 2009. 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 In June of that year, we performed re-excision that again showed invasive disease. She had been lost to follow up from [place] since April 2012.  REVIEW OF SYSTEMS: She denies any chest pain, shortness of breath, nausea, vomiting, fevers, chills, headaches, visual changes. When I asked her again about her exercise, she states that she is not very  active when she is exercise, but occasional with exercise she will experience chest pain, so we readdress the issue of the chest pain and she states she occasionally has chest pain at the time of rest, also with activity. It may also be associated with her GERD. Review of systems is otherwise negative.  She continues to have her sciatic pain.  She has not really addressed this with her MDs.  She had some issues with constipation and was seen by GI medicine and had a colonoscopy which revealed a benign polyp. She is due for her MMG.  10 point ROS is otherwise negative.  Interval History:  She has not had any GYN care since we last saw her.   Current Meds:  Outpatient Encounter Prescriptions as of 12/16/2011  Medication Sig Dispense Refill  . aspirin 81 MG tablet Take 81 mg by mouth daily.      . DULoxetine (CYMBALTA) 30 MG capsule Take 30 mg by mouth 2 (two) times daily.      Marland Kitchen tolterodine (DETROL) 2 MG tablet Take 2 mg by mouth 2 (two) times daily.      Marland Kitchen  omeprazole (PRILOSEC) 20 MG capsule Take 20 mg by mouth 2 (two) times daily.        Allergy:  Allergies  Allergen Reactions  . Codeine     REACTION: Nausea    Social Hx:   History   Social History  . Marital Status: Divorced    Spouse Name: N/A    Number of Children: N/A  . Years of Education: N/A   Occupational History  . Not on file.   Social History Main Topics  . Smoking status: Current Everyday Smoker -- 1.0 packs/day for 20 years    Types: Cigarettes  . Smokeless tobacco: Not on file  . Alcohol Use: No  . Drug Use: No  . Sexually Active:    Other Topics Concern  . Not on file   Social History Narrative  . No narrative on file    Past Surgical Hx:  Past Surgical History  Procedure Date  . Exc of vulvar cancer   . Abdominal hysterectomy   . Colonoscopy 08/03/2011    Procedure: COLONOSCOPY;  Surgeon: Graylin Shiver, MD;  Location: [facility];  Service: Endoscopy;  Laterality: N/A;    Past Medical Hx:  Past Medical History  Diagnosis Date  . GERD (gastroesophageal reflux disease)   . Depression   . Complication of anesthesia   . PONV (postoperative nausea and vomiting)  Family Hx: History reviewed. No pertinent family history.  Vitals:  Blood pressure 120/70, pulse 74, temperature 99 F (37.2 C), temperature source Oral, resp. rate 18, height 5' 4.33" (1.634 m), weight 133 lb 14.4 oz (60.737 kg).  Physical Exam:  GENERAL: Well-nourished, well-developed female, in no acute distress.  NECK: Supple. There is no lymphadenopathy, no thyromegaly.  LUNGS: Clear to auscultation bilaterally.  CARDIOVASCULAR: Regular rate and rhythm.  ABDOMEN: Soft, nontender, nondistended. There are no palpable masses  or hepatosplenomegaly. Groins are negative for adenopathy.  EXTREMITIES: No edema.  PELVIC: External genitalia is notable for surgical excision of the vulva. There is no visible lesions. There are no palpable masses. Vagina atrophic, vaginal cuff without  lesions, pap smear submitted, no masses or nodularity.  Assessment/Plan:  ASSESSMENT: A 56 year old with history of multifocal vulvar carcinoma, who clinically has no evidence of recurrent disease. She has multiple other complaints, however.  1. With regard to her chest pain, her review of systems is very difficult to elucidate. She will discuss this with her primary MD. She denies any pain now. 2. With regard to her hip pain, it does sound like this could be sciatica and she will certainly address this with her primary care physician. It is not changed since last year. 3. With regard to her cancer, she has no evidence of disease at this point. She will see Dr. Tamela Oddi in 6 months and return to see [place] in 12 months. 4. We will notify her of the results of her pap smear and she takes the responsibility to schedule her annual mammogram. 5. Smoking cessation information and resource information given to her today.  Charell Faulk A., MD 12/16/2011, 11:57 AM

## 2011-12-16 NOTE — Addendum Note (Signed)
Addended by: Randel Pigg on: 12/16/2011 12:59 PM   Modules accepted: Orders

## 2011-12-20 ENCOUNTER — Telehealth: Payer: Self-pay | Admitting: Gynecologic Oncology

## 2011-12-20 NOTE — Telephone Encounter (Signed)
Pt notified about pap results: negative.  No questions or concerns voiced. 

## 2011-12-31 ENCOUNTER — Other Ambulatory Visit (HOSPITAL_COMMUNITY): Payer: Self-pay | Admitting: Family Medicine

## 2011-12-31 DIAGNOSIS — Z1231 Encounter for screening mammogram for malignant neoplasm of breast: Secondary | ICD-10-CM

## 2012-01-20 ENCOUNTER — Ambulatory Visit (HOSPITAL_COMMUNITY)
Admission: RE | Admit: 2012-01-20 | Discharge: 2012-01-20 | Disposition: A | Discharge status: Home / Self Care | Payer: Medicaid Other | Source: Ambulatory Visit | Attending: Family Medicine | Admitting: Family Medicine

## 2012-01-20 DIAGNOSIS — Z1231 Encounter for screening mammogram for malignant neoplasm of breast: Secondary | ICD-10-CM | POA: Insufficient documentation

## 2012-06-19 DIAGNOSIS — F331 Major depressive disorder, recurrent, moderate: Secondary | ICD-10-CM | POA: Diagnosis not present

## 2012-07-18 DIAGNOSIS — M722 Plantar fascial fibromatosis: Secondary | ICD-10-CM | POA: Diagnosis not present

## 2012-08-21 DIAGNOSIS — F331 Major depressive disorder, recurrent, moderate: Secondary | ICD-10-CM | POA: Diagnosis not present

## 2012-10-28 ENCOUNTER — Emergency Department (HOSPITAL_COMMUNITY)
Admission: EM | Admit: 2012-10-28 | Discharge: 2012-10-28 | Disposition: A | Discharge status: Home / Self Care | Payer: PRIVATE HEALTH INSURANCE | Attending: Emergency Medicine | Admitting: Emergency Medicine

## 2012-10-28 ENCOUNTER — Encounter (HOSPITAL_COMMUNITY): Payer: Self-pay | Admitting: Emergency Medicine

## 2012-10-28 ENCOUNTER — Emergency Department (HOSPITAL_COMMUNITY): Payer: PRIVATE HEALTH INSURANCE

## 2012-10-28 DIAGNOSIS — S199XXA Unspecified injury of neck, initial encounter: Secondary | ICD-10-CM | POA: Diagnosis not present

## 2012-10-28 DIAGNOSIS — T148XXA Other injury of unspecified body region, initial encounter: Secondary | ICD-10-CM

## 2012-10-28 DIAGNOSIS — F17200 Nicotine dependence, unspecified, uncomplicated: Secondary | ICD-10-CM | POA: Insufficient documentation

## 2012-10-28 DIAGNOSIS — S0993XA Unspecified injury of face, initial encounter: Secondary | ICD-10-CM | POA: Diagnosis present

## 2012-10-28 DIAGNOSIS — S0990XA Unspecified injury of head, initial encounter: Secondary | ICD-10-CM | POA: Diagnosis not present

## 2012-10-28 DIAGNOSIS — IMO0002 Reserved for concepts with insufficient information to code with codable children: Secondary | ICD-10-CM | POA: Insufficient documentation

## 2012-10-28 DIAGNOSIS — G44309 Post-traumatic headache, unspecified, not intractable: Secondary | ICD-10-CM | POA: Diagnosis not present

## 2012-10-28 DIAGNOSIS — Y9389 Activity, other specified: Secondary | ICD-10-CM | POA: Diagnosis not present

## 2012-10-28 DIAGNOSIS — Z8719 Personal history of other diseases of the digestive system: Secondary | ICD-10-CM | POA: Insufficient documentation

## 2012-10-28 DIAGNOSIS — R42 Dizziness and giddiness: Secondary | ICD-10-CM | POA: Insufficient documentation

## 2012-10-28 DIAGNOSIS — S335XXA Sprain of ligaments of lumbar spine, initial encounter: Secondary | ICD-10-CM | POA: Diagnosis not present

## 2012-10-28 DIAGNOSIS — R112 Nausea with vomiting, unspecified: Secondary | ICD-10-CM | POA: Insufficient documentation

## 2012-10-28 DIAGNOSIS — Z79899 Other long term (current) drug therapy: Secondary | ICD-10-CM | POA: Insufficient documentation

## 2012-10-28 DIAGNOSIS — S139XXA Sprain of joints and ligaments of unspecified parts of neck, initial encounter: Secondary | ICD-10-CM | POA: Insufficient documentation

## 2012-10-28 DIAGNOSIS — F329 Major depressive disorder, single episode, unspecified: Secondary | ICD-10-CM | POA: Insufficient documentation

## 2012-10-28 DIAGNOSIS — Y92410 Unspecified street and highway as the place of occurrence of the external cause: Secondary | ICD-10-CM | POA: Insufficient documentation

## 2012-10-28 DIAGNOSIS — Z7982 Long term (current) use of aspirin: Secondary | ICD-10-CM | POA: Insufficient documentation

## 2012-10-28 DIAGNOSIS — M542 Cervicalgia: Secondary | ICD-10-CM

## 2012-10-28 DIAGNOSIS — F3289 Other specified depressive episodes: Secondary | ICD-10-CM | POA: Insufficient documentation

## 2012-10-28 MED ORDER — ONDANSETRON HCL 4 MG PO TABS: 4.0000 mg | ORAL_TABLET | Freq: Four times a day (QID) | ORAL | Status: DC

## 2012-10-28 MED ORDER — TRAMADOL HCL 50 MG PO TABS: 50.0000 mg | ORAL_TABLET | Freq: Four times a day (QID) | ORAL | Status: DC | PRN

## 2012-10-28 MED ORDER — TRAMADOL HCL 50 MG PO TABS
50.0000 mg | ORAL_TABLET | Freq: Once | ORAL | Status: AC
  Administered 2012-10-28: 50 mg via ORAL
  Filled 2012-10-28: qty 1

## 2012-10-28 MED ORDER — ONDANSETRON 8 MG PO TBDP
8.0000 mg | ORAL_TABLET | Freq: Once | ORAL | Status: AC
  Administered 2012-10-28: 8 mg via ORAL
  Filled 2012-10-28: qty 1

## 2012-10-28 MED ORDER — CYCLOBENZAPRINE HCL 10 MG PO TABS: 10.0000 mg | ORAL_TABLET | Freq: Two times a day (BID) | ORAL | Status: DC | PRN

## 2012-10-28 NOTE — ED Provider Notes (Signed)
Medical screening examination/treatment/procedure(s) were performed by non-physician practitioner and as supervising physician I was immediately available for consultation/collaboration.  Jones Skene, M.D.     Jones Skene, MD 10/28/12 [phone]
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
## 2012-10-28 NOTE — ED Notes (Signed)
Patient transported to CT 

## 2013-04-09 ENCOUNTER — Encounter: Payer: Self-pay | Admitting: Podiatry

## 2013-04-09 ENCOUNTER — Ambulatory Visit (INDEPENDENT_AMBULATORY_CARE_PROVIDER_SITE_OTHER): Payer: Medicare Other | Admitting: Podiatry

## 2013-04-09 VITALS — BP 134/84 | HR 68 | Ht 63.0 in | Wt 139.0 lb

## 2013-04-09 DIAGNOSIS — Q828 Other specified congenital malformations of skin: Secondary | ICD-10-CM

## 2013-04-09 DIAGNOSIS — M25579 Pain in unspecified ankle and joints of unspecified foot: Secondary | ICD-10-CM | POA: Diagnosis not present

## 2013-04-09 NOTE — Progress Notes (Signed)
Came in for pain on left heel with callus. Also gets frequent ankle pain. Was seen here a year ago for the same problem.  Plantar callus debrided. No new problems.

## 2013-04-09 NOTE — Patient Instructions (Signed)
Pain on left heel. Hard callus under left heel trimmed. Return as needed.

## 2013-08-19 ENCOUNTER — Encounter (HOSPITAL_COMMUNITY): Payer: Self-pay | Admitting: Emergency Medicine

## 2013-08-19 ENCOUNTER — Emergency Department (INDEPENDENT_AMBULATORY_CARE_PROVIDER_SITE_OTHER)
Admission: EM | Admit: 2013-08-19 | Discharge: 2013-08-19 | Disposition: A | Discharge status: Home / Self Care | Payer: Medicare Other | Source: Home / Self Care | Attending: Family Medicine | Admitting: Family Medicine

## 2013-08-19 ENCOUNTER — Emergency Department (INDEPENDENT_AMBULATORY_CARE_PROVIDER_SITE_OTHER): Payer: Medicare Other

## 2013-08-19 DIAGNOSIS — A088 Other specified intestinal infections: Secondary | ICD-10-CM

## 2013-08-19 DIAGNOSIS — A084 Viral intestinal infection, unspecified: Secondary | ICD-10-CM

## 2013-08-19 DIAGNOSIS — R059 Cough, unspecified: Secondary | ICD-10-CM | POA: Diagnosis not present

## 2013-08-19 DIAGNOSIS — J9819 Other pulmonary collapse: Secondary | ICD-10-CM | POA: Diagnosis not present

## 2013-08-19 DIAGNOSIS — J9811 Atelectasis: Secondary | ICD-10-CM

## 2013-08-19 DIAGNOSIS — R05 Cough: Secondary | ICD-10-CM | POA: Diagnosis not present

## 2013-08-19 LAB — POCT I-STAT, CHEM 8
BUN: 8 mg/dL (ref 6–23)
CALCIUM ION: 1.18 mmol/L (ref 1.12–1.23)
Chloride: 102 mEq/L (ref 96–112)
Creatinine, Ser: 0.7 mg/dL (ref 0.50–1.10)
Glucose, Bld: 112 mg/dL — ABNORMAL HIGH (ref 70–99)
HEMATOCRIT: 43 % (ref 36.0–46.0)
HEMOGLOBIN: 14.6 g/dL (ref 12.0–15.0)
Potassium: 3.5 mEq/L — ABNORMAL LOW (ref 3.7–5.3)
Sodium: 141 mEq/L (ref 137–147)
TCO2: 24 mmol/L (ref 0–100)

## 2013-08-19 LAB — CBC WITH DIFFERENTIAL/PLATELET
BASOS ABS: 0.1 10*3/uL (ref 0.0–0.1)
BASOS PCT: 1 % (ref 0–1)
EOS PCT: 0 % (ref 0–5)
Eosinophils Absolute: 0 10*3/uL (ref 0.0–0.7)
HEMATOCRIT: 39.1 % (ref 36.0–46.0)
Hemoglobin: 13.5 g/dL (ref 12.0–15.0)
Lymphocytes Relative: 51 % — ABNORMAL HIGH (ref 12–46)
Lymphs Abs: 3 10*3/uL (ref 0.7–4.0)
MCH: 33.1 pg (ref 26.0–34.0)
MCHC: 34.5 g/dL (ref 30.0–36.0)
MCV: 95.8 fL (ref 78.0–100.0)
MONO ABS: 1 10*3/uL (ref 0.1–1.0)
Monocytes Relative: 18 % — ABNORMAL HIGH (ref 3–12)
Neutro Abs: 1.7 10*3/uL (ref 1.7–7.7)
Neutrophils Relative %: 30 % — ABNORMAL LOW (ref 43–77)
Platelets: 228 10*3/uL (ref 150–400)
RBC: 4.08 MIL/uL (ref 3.87–5.11)
RDW: 14.4 % (ref 11.5–15.5)
WBC: 5.8 10*3/uL (ref 4.0–10.5)

## 2013-08-19 MED ORDER — PROMETHAZINE-DM 6.25-15 MG/5ML PO SYRP: 10.0000 mL | ORAL_SOLUTION | Freq: Four times a day (QID) | ORAL | Status: DC | PRN

## 2013-08-19 MED ORDER — LOPERAMIDE HCL 1 MG/5ML PO LIQD: 2.0000 mg | Freq: Four times a day (QID) | ORAL | Status: DC | PRN

## 2013-08-19 NOTE — ED Provider Notes (Signed)
CSN: [id number]     Arrival date & time 08/19/13  1651 History   First MD Initiated Contact with Patient 08/19/13 1732     Chief Complaint  Patient presents with  . Emesis  . Diarrhea     (Consider location/radiation/quality/duration/timing/severity/associated sxs/prior Treatment) HPI Comments: 58 year old female presents complaining of nausea, vomiting, diarrhea, cough, fever, headache, body aches, dizziness. This started 4 days ago. She was  very ill and unable to get out of bed to come in until today, she is feeling slightly better today than she was. She is still feeling nauseous and having diarrhea. She feels like she is coughing up a lot of mucus that sometimes gets caught in her throat that makes it hard to breathe. She feels like she gets dizzy when she has a prolonged episode of coughing. She is still having subjective fever. She is also feeling slightly short of breath. She denies pleuritic chest pain. Denies any blood in her diarrhea or vomitus. Denies recent travel or sick contacts.  Patient is a 59 y.o. female presenting with vomiting and diarrhea.  Emesis Associated symptoms: arthralgias, chills, diarrhea, headaches, myalgias and sore throat   Associated symptoms: no abdominal pain   Diarrhea Associated symptoms: arthralgias, chills, fever, headaches, myalgias and vomiting   Associated symptoms: no abdominal pain     Past Medical History  Diagnosis Date  . GERD (gastroesophageal reflux disease)   . Depression   . Complication of anesthesia   . PONV (postoperative nausea and vomiting)    Past Surgical History  Procedure Laterality Date  . Exc of vulvar cancer    . Abdominal hysterectomy    . Colonoscopy  08/03/2011    Procedure: COLONOSCOPY;  Surgeon: Wonda Horner, MD;  Location: [facility];  Service: Endoscopy;  Laterality: N/A;   History reviewed. No pertinent family history. History  Substance Use Topics  . Smoking status: Current Every Day Smoker -- 1.00  packs/day for 20 years    Types: Cigarettes  . Smokeless tobacco: Never Used  . Alcohol Use: No   OB History   Grav Para Term Preterm Abortions TAB SAB Ect Mult Living                 Review of Systems  Constitutional: Positive for fever, chills and fatigue.  HENT: Positive for sore throat. Negative for congestion, ear pain, postnasal drip, rhinorrhea and sinus pressure.   Eyes: Negative for visual disturbance.  Respiratory: Positive for cough, chest tightness and shortness of breath. Negative for wheezing.   Cardiovascular: Negative for chest pain, palpitations and leg swelling.  Gastrointestinal: Positive for nausea, vomiting and diarrhea. Negative for abdominal pain and blood in stool.  Endocrine: Negative for polydipsia and polyuria.  Genitourinary: Negative for dysuria, urgency and frequency.  Musculoskeletal: Positive for arthralgias and myalgias.  Skin: Negative for rash.  Neurological: Positive for headaches. Negative for dizziness, weakness and light-headedness.      Allergies  Codeine  Home Medications   Current Outpatient Rx  Name  Route  Sig  Dispense  Refill  . aspirin 81 MG tablet   Oral   Take 81 mg by mouth every morning.          . loperamide (IMODIUM) 1 MG/5ML solution   Oral   Take 10 mLs (2 mg total) by mouth 4 (four) times daily as needed for diarrhea or loose stools.   120 mL   0   . promethazine-dextromethorphan (PROMETHAZINE-DM) 6.25-15 MG/5ML syrup   Oral  Take 10 mLs by mouth 4 (four) times daily as needed for cough.   118 mL   0   . traMADol (ULTRAM) 50 MG tablet   Oral   Take 1 tablet (50 mg total) by mouth every 6 (six) hours as needed for pain.   15 tablet   0    BP 132/82  Pulse 104  Temp(Src) 99.5 F (37.5 C) (Oral)  Resp 20  SpO2 94% Physical Exam  Nursing note and vitals reviewed. Constitutional: She is oriented to person, place, and time. Vital signs are normal. She appears well-developed and well-nourished. No  distress.  HENT:  Head: Normocephalic and atraumatic.  Right Ear: External ear normal.  Left Ear: External ear normal.  Nose: Nose normal. Right sinus exhibits no maxillary sinus tenderness and no frontal sinus tenderness. Left sinus exhibits no maxillary sinus tenderness and no frontal sinus tenderness.  Mouth/Throat: Uvula is midline and mucous membranes are normal. Posterior oropharyngeal erythema (mild) present. No oropharyngeal exudate.  Eyes: Conjunctivae are normal. Right eye exhibits no discharge. Left eye exhibits no discharge.  Cardiovascular: Regular rhythm, normal heart sounds and normal pulses.  Tachycardia present.  Exam reveals no gallop.   No murmur heard. Pulmonary/Chest: Effort normal and breath sounds normal. No respiratory distress.  Abdominal: Soft. Normal appearance. There is no hepatosplenomegaly. There is no tenderness. There is no rebound, no guarding and no CVA tenderness.  Neurological: She is alert and oriented to person, place, and time. She has normal strength. Coordination normal.  Skin: Skin is warm and dry. No rash noted. She is not diaphoretic.  Psychiatric: She has a normal mood and affect. Judgment normal.    ED Course  Procedures (including critical care time) Labs Review Labs Reviewed  CBC WITH DIFFERENTIAL - Abnormal; Notable for the following:    Neutrophils Relative % 30 (*)    Lymphocytes Relative 51 (*)    Monocytes Relative 18 (*)    All other components within normal limits  POCT I-STAT, CHEM 8 - Abnormal; Notable for the following:    Potassium 3.5 (*)    Glucose, Bld 112 (*)    All other components within normal limits  I-STAT CHEM 8, ED   Imaging Review Dg Chest 2 View  08/19/2013   CLINICAL DATA:  Cough for 3-4 days  EXAM: CHEST  2 VIEW  COMPARISON:  5/27/9  FINDINGS: Heart size is normal. No pleural effusion or edema. Atelectasis versus scar is noted in the left base. No airspace consolidation. Coarsened interstitial markings are  noted bilaterally. The visualized skeletal structures appear normal.  IMPRESSION: 1. Left base scar versus atelectasis.   Electronically Signed   By: Kerby Moors M.D.   On: 08/19/2013 18:17   Not orthostatic Chest x-ray came back showing atelectasis, discussed with attending, we will get a CBC and make decision on whether to treat as pneumonia if she has leukocytosis CBC came back normal, as did i-STAT chem 8 Patient started to feel slightly dizzy and nauseated began while waiting for the CBC to come back, obtain EKG which was normal. I examined her after this and she was feeling fine again, no longer dizzy or nauseated   MDM   Final diagnoses:  Viral gastroenteritis  Atelectasis   This patient I believe is suffering from viral gastroenteritis, and prolonged bedrest has led to atelectasis. We'll treat with an incentive spirometer, as well as symptomatic management of her gastroenteritis and cough. She will followup in 2 days for a  recheck. If she worsens in the meantime, she will followup sooner.   Meds ordered this encounter  Medications  . promethazine-dextromethorphan (PROMETHAZINE-DM) 6.25-15 MG/5ML syrup    Sig: Take 10 mLs by mouth 4 (four) times daily as needed for cough.    Dispense:  118 mL    Refill:  0    Order Specific Question:  Supervising Provider    Answer:  Lynne Leader, [place]  . loperamide (IMODIUM) 1 MG/5ML solution    Sig: Take 10 mLs (2 mg total) by mouth 4 (four) times daily as needed for diarrhea or loose stools.    Dispense:  120 mL    Refill:  0    Order Specific Question:  Supervising Provider    Answer:  Lynne Leader, [place]     Liam Graham, PA-C 08/19/13 2018

## 2013-08-19 NOTE — ED Notes (Signed)
C/o vomiting and diarrhea States she has a dry cough and has hot/cold chills OTC medication taking but no relief.

## 2013-08-19 NOTE — Discharge Instructions (Signed)
Diet for Diarrhea, Adult Frequent, runny stools (diarrhea) may be caused or worsened by food or drink. Diarrhea may be relieved by changing your diet. Since diarrhea can last up to 7 days, it is easy for you to lose too much fluid from the body and become dehydrated. Fluids that are lost need to be replaced. Along with a modified diet, make sure you drink enough fluids to keep your urine clear or pale yellow. DIET INSTRUCTIONS  Ensure adequate fluid intake (hydration): have 1 cup (8 oz) of fluid for each diarrhea episode. Avoid fluids that contain simple sugars or sports drinks, fruit juices, whole milk products, and sodas. Your urine should be clear or pale yellow if you are drinking enough fluids. Hydrate with an oral rehydration solution that you can purchase at pharmacies, retail stores, and online. You can prepare an oral rehydration solution at home by mixing the following ingredients together:    tsp table salt.   tsp baking soda.   tsp salt substitute containing potassium chloride.  1  tablespoons sugar.  1 L (34 oz) of water.  Certain foods and beverages may increase the speed at which food moves through the gastrointestinal (GI) tract. These foods and beverages should be avoided and include:  Caffeinated and alcoholic beverages.  High-fiber foods, such as raw fruits and vegetables, nuts, seeds, and whole grain breads and cereals.  Foods and beverages sweetened with sugar alcohols, such as xylitol, sorbitol, and mannitol.  Some foods may be well tolerated and may help thicken stool including:  Starchy foods, such as rice, toast, pasta, low-sugar cereal, oatmeal, grits, baked potatoes, crackers, and bagels.   Bananas.   Applesauce.  Add probiotic-rich foods to help increase healthy bacteria in the GI tract, such as yogurt and fermented milk products. RECOMMENDED FOODS AND BEVERAGES Starches Choose foods with less than 2 g of fiber per serving.  Recommended:  White,  [place], and pita breads, plain rolls, buns, bagels. Plain muffins, matzo. Soda, saltine, or graham crackers. Pretzels, melba toast, zwieback. Cooked cereals made with water: cornmeal, farina, cream cereals. Dry cereals: refined corn, wheat, rice. Potatoes prepared any way without skins, refined macaroni, spaghetti, noodles, refined rice.  Avoid:  Bread, rolls, or crackers made with whole wheat, multi-grains, rye, bran seeds, nuts, or coconut. Corn tortillas or taco shells. Cereals containing whole grains, multi-grains, bran, coconut, nuts, raisins. Cooked or dry oatmeal. Coarse wheat cereals, granola. Cereals advertised as "high-fiber." Potato skins. Whole grain pasta, wild or brown rice. Popcorn. Sweet potatoes, yams. Sweet rolls, doughnuts, waffles, pancakes, sweet breads. Vegetables  Recommended: Strained tomato and vegetable juices. Most well-cooked and canned vegetables without seeds. Fresh: Tender lettuce, cucumber without the skin, cabbage, spinach, bean sprouts.  Avoid: Fresh, cooked, or canned: Artichokes, baked beans, beet greens, broccoli, Brussels sprouts, corn, kale, legumes, peas, sweet potatoes. Cooked: Green or red cabbage, spinach. Avoid large servings of any vegetables because vegetables shrink when cooked, and they contain more fiber per serving than fresh vegetables. Fruit  Recommended: Cooked or canned: Apricots, applesauce, cantaloupe, cherries, fruit cocktail, grapefruit, grapes, kiwi, mandarin oranges, peaches, pears, plums, watermelon. Fresh: Apples without skin, ripe banana, grapes, cantaloupe, cherries, grapefruit, peaches, oranges, plums. Keep servings limited to  cup or 1 piece.  Avoid: Fresh: Apples with skin, apricots, mangoes, pears, raspberries, strawberries. Prune juice, stewed or dried prunes. Dried fruits, raisins, dates. Large servings of all fresh fruits. Protein  Recommended: Ground or well-cooked tender beef, ham, veal, lamb, pork, or poultry. Eggs. Fish,  oysters, shrimp,  lobster, other seafoods. Liver, organ meats.  Avoid: Tough, fibrous meats with gristle. Peanut butter, smooth or chunky. Cheese, nuts, seeds, legumes, dried peas, beans, lentils. Dairy  Recommended: Yogurt, lactose-free milk, kefir, drinkable yogurt, buttermilk, soy milk, or plain hard cheese.  Avoid: Milk, chocolate milk, beverages made with milk, such as milkshakes. Soups  Recommended: Bouillon, broth, or soups made from allowed foods. Any strained soup.  Avoid: Soups made from vegetables that are not allowed, cream or milk-based soups. Desserts and Sweets  Recommended: Sugar-free gelatin, sugar-free frozen ice pops made without sugar alcohol.  Avoid: Plain cakes and cookies, pie made with fruit, pudding, custard, cream pie. Gelatin, fruit, ice, sherbet, frozen ice pops. Ice cream, ice milk without nuts. Plain hard candy, honey, jelly, molasses, syrup, sugar, chocolate syrup, gumdrops, marshmallows. Fats and Oils  Recommended: Limit fats to less than 8 tsp per day.  Avoid: Seeds, nuts, olives, avocados. Margarine, butter, cream, mayonnaise, salad oils, plain salad dressings. Plain gravy, crisp bacon without rind. Beverages  Recommended: Water, decaffeinated teas, oral rehydration solutions, sugar-free beverages not sweetened with sugar alcohols.  Avoid: Fruit juices, caffeinated beverages (coffee, tea, soda), alcohol, sports drinks, or lemon-lime soda. Condiments  Recommended: Ketchup, mustard, horseradish, vinegar, cocoa powder. Spices in moderation: allspice, basil, bay leaves, celery powder or leaves, cinnamon, cumin powder, curry powder, ginger, mace, marjoram, onion or garlic powder, oregano, paprika, parsley flakes, ground pepper, rosemary, sage, savory, tarragon, thyme, turmeric.  Avoid: Coconut, honey. Document Released: 09/04/2003 Document Revised: 03/08/2012 Document Reviewed: 10/29/2011 [facility] Patient Information 2014 [place].  Viral  Gastroenteritis Viral gastroenteritis is also known as stomach flu. This condition affects the stomach and intestinal tract. It can cause sudden diarrhea and vomiting. The illness typically lasts 3 to 8 days. Most people develop an immune response that eventually gets rid of the virus. While this natural response develops, the virus can make you quite ill. CAUSES  Many different viruses can cause gastroenteritis, such as rotavirus or noroviruses. You can catch one of these viruses by consuming contaminated food or water. You may also catch a virus by sharing utensils or other personal items with an infected person or by touching a contaminated surface. SYMPTOMS  The most common symptoms are diarrhea and vomiting. These problems can cause a severe loss of body fluids (dehydration) and a body salt (electrolyte) imbalance. Other symptoms may include:  Fever.  Headache.  Fatigue.  Abdominal pain. DIAGNOSIS  Your caregiver can usually diagnose viral gastroenteritis based on your symptoms and a physical exam. A stool sample may also be taken to test for the presence of viruses or other infections. TREATMENT  This illness typically goes away on its own. Treatments are aimed at rehydration. The most serious cases of viral gastroenteritis involve vomiting so severely that you are not able to keep fluids down. In these cases, fluids must be given through an intravenous line (IV). HOME CARE INSTRUCTIONS   Drink enough fluids to keep your urine clear or pale yellow. Drink small amounts of fluids frequently and increase the amounts as tolerated.  Ask your caregiver for specific rehydration instructions.  Avoid:  Foods high in sugar.  Alcohol.  Carbonated drinks.  Tobacco.  Juice.  Caffeine drinks.  Extremely hot or cold fluids.  Fatty, greasy foods.  Too much intake of anything at one time.  Dairy products until 24 to 48 hours after diarrhea stops.  You may consume probiotics.  Probiotics are active cultures of beneficial bacteria. They may lessen the amount and number  of diarrheal stools in adults. Probiotics can be found in yogurt with active cultures and in supplements.  Wash your hands well to avoid spreading the virus.  Only take over-the-counter or prescription medicines for pain, discomfort, or fever as directed by your caregiver. Do not give aspirin to children. Antidiarrheal medicines are not recommended.  Ask your caregiver if you should continue to take your regular prescribed and over-the-counter medicines.  Keep all follow-up appointments as directed by your caregiver. SEEK IMMEDIATE MEDICAL CARE IF:   You are unable to keep fluids down.  You do not urinate at least once every 6 to 8 hours.  You develop shortness of breath.  You notice blood in your stool or vomit. This may look like coffee grounds.  You have abdominal pain that increases or is concentrated in one small area (localized).  You have persistent vomiting or diarrhea.  You have a fever.  The patient is a child younger than 3 months, and he or she has a fever.  The patient is a child older than 3 months, and he or she has a fever and persistent symptoms.  The patient is a child older than 3 months, and he or she has a fever and symptoms suddenly get worse.  The patient is a baby, and he or she has no tears when crying. MAKE SURE YOU:   Understand these instructions.  Will watch your condition.  Will get help right away if you are not doing well or get worse. Document Released: 06/14/2005 Document Revised: 09/06/2011 Document Reviewed: 03/31/2011 [facility] Patient Information 2014 [place].  Atelectasis, Adult Atelectasis is a collapse of the small air sacs in the lungs (alveoli). When this occurs, all or part of a lung collapses and becomes airless. It can be caused by various things and is a common problem after surgery. The severity of atelectasis will vary  depending on the size of the area involved and the underlying cause of the condition. CAUSES  There are multiple causes for atelectasis:   Shallow breathing, particularly if there is an injury to your chest wall or abdomen that makes it painful to take a deep breath. This commonly occurs after surgery.  Obstruction of your airways (bronchi or bronchioles). This may be caused by a buildup of mucus (mucus plug), tumors, blood clots (pulmonary embolus), or inhaled foreign bodies. Mucus plugs occur when the lungs do not expand enough to get rid of mucus.  Outside pressure on the lung. This may be caused by tumors, fluid (pleural effusion), or a leakage of air between the lung and rib cage (pneumothorax).   Infections such as pneumonia.  Scarring in lung tissue left over from previous infection or injury.  Some diseases such as cystic fibrosis. SIGNS AND SYMPTOMS  Often, atelectasis will have no symptoms. When symptoms occur, they include:  Shortness of breath.   Bluish color to your nails, lips, or mouth (cyanosis). DIAGNOSIS  Your health care provider may suspect atelectasis based on symptoms and physical findings. A chest X-ray may be done to confirm the diagnosis. More specialized X-ray exams are sometimes required.  TREATMENT  Treatment will depend on the cause of the atelectasis. Treatment may include:  Purposeful coughing to loosen mucus plugs in the lungs.  Chest physiotherapy. This consists of clapping or percussion on the chest over the lungs to further loosen mucus plugs.  Postural drainage techniques. This involves positioning your body so your head is lower than your chest. [place]  relaxed deep breathing whenever you are sitting down. A good technique is to take a few relaxed deep breaths each time a commercial comes on if you are watching television.  If you were given a deep breathing device (such as an incentive spirometer) or a mucus clearance  device, use this regularly as directed by your health care provider.  Try to cough several times a day as directed by your health care provider.  Perform any chest physiotherapy or postural drainage techniques as directed by your health care provider. If necessary, have someone (such as a family member) assist you with these techniques.  When lying down, lie on the unaffected side to encourage mucus drainage.  Stay physically active as much as possible. SEEK IMMEDIATE MEDICAL CARE IF:   You develop increasing problems with your breathing.   You develop severe chest pain.   You develop severe coughing, or you cough up blood.   You have a fever or persistent symptoms for more than 2 3 days.   You have a fever and your symptoms suddenly get worse.  Document Released: 06/14/2005 Document Revised: 02/14/2013 Document Reviewed: 12/20/2012 [facility] Patient Information 2014 [place].

## 2013-08-21 NOTE — ED Provider Notes (Signed)
Medical screening examination/treatment/procedure(s) were performed by a resident physician or non-physician practitioner and as the supervising physician I was immediately available for consultation/collaboration.  Lynne Leader, MD    Gregor Hams, MD 08/21/13 0730

## 2013-10-19 ENCOUNTER — Encounter: Payer: Self-pay | Admitting: Obstetrics & Gynecology

## 2013-10-19 ENCOUNTER — Ambulatory Visit (INDEPENDENT_AMBULATORY_CARE_PROVIDER_SITE_OTHER): Payer: Medicare Other | Admitting: Obstetrics & Gynecology

## 2013-10-19 VITALS — BP 133/77 | HR 97 | Temp 98.1°F | Ht 63.0 in | Wt 135.0 lb

## 2013-10-19 DIAGNOSIS — Z01419 Encounter for gynecological examination (general) (routine) without abnormal findings: Secondary | ICD-10-CM

## 2013-10-19 DIAGNOSIS — E8941 Symptomatic postprocedural ovarian failure: Secondary | ICD-10-CM

## 2013-10-19 DIAGNOSIS — Z8540 Personal history of malignant neoplasm of unspecified female genital organ: Secondary | ICD-10-CM | POA: Diagnosis not present

## 2013-10-19 MED ORDER — ESTRADIOL 0.025 MG/24HR TD PTWK: 0.0250 mg | MEDICATED_PATCH | TRANSDERMAL | Status: DC

## 2013-10-19 NOTE — Progress Notes (Signed)
Subjective:     [name] is a 58 y.o. female here for a routine exam.   Personal health questionnaire:  Is patient [name], have a family history of breast and/or ovarian cancer: no Is there a family history of uterine cancer diagnosed at age < 65, gastrointestinal cancer, urinary tract cancer, family member who is a Field seismologist syndrome-associated carrier: no  Gynecologic History No LMP recorded. Patient has had a hysterectomy. Last mammogram: 1 yr ago. Results were: normal  Obstetric History OB History  Gravida Para Term Preterm AB SAB TAB Ectopic Multiple Living  2 2 2  0 0 0 0 0 0 2    # Outcome Date GA Lbr Len/2nd Weight Sex Delivery Anes PTL Lv  2 TRM 03/20/87    F SVD None  Y  1 TRM 05/26/76   3.175 kg (7 lb) F SVD None  Y      Past Medical History  Diagnosis Date  . GERD (gastroesophageal reflux disease)   . Depression   . Complication of anesthesia   . PONV (postoperative nausea and vomiting)     Past Surgical History  Procedure Laterality Date  . Exc of vulvar cancer    . Abdominal hysterectomy    . Colonoscopy  08/03/2011    Procedure: COLONOSCOPY;  Surgeon: Wonda Horner, MD;  Location: [facility];  Service: Endoscopy;  Laterality: N/A;    Current outpatient prescriptions:aspirin 81 MG tablet, Take 81 mg by mouth every morning. , Disp: , Rfl: ;  estradiol (CLIMARA) 0.025 mg/24hr patch, Place 1 patch (0.025 mg total) onto the skin once a week., Disp: 8 patch, Rfl: 3 Allergies  Allergen Reactions  . Codeine Nausea Only    REACTION: Nausea    History  Substance Use Topics  . Smoking status: Current Every Day Smoker -- 1.00 packs/day for 20 years    Types: Cigarettes  . Smokeless tobacco: Never Used  . Alcohol Use: No    Family History  Problem Relation Age of Onset  . Hypertension Mother   . Stroke Mother       Review of Systems  Constitutional: negative for fatigue and weight loss Respiratory: negative for cough and  wheezing Cardiovascular: negative for chest pain, fatigue and palpitations Gastrointestinal: negative for abdominal pain and change in bowel habits Musculoskeletal:negative for myalgias Neurological: negative for gait problems and tremors Behavioral/Psych: negative for abusive relationship, depression Endocrine: negative for temperature intolerance   Genitourinary:negative for genital lesions, sexual problems and vaginal discharge; positive for hot flushes Integument/breast: negative for breast lump, breast tenderness, nipple discharge and skin lesion(s)    Objective:       General:   alert  Skin:   no rash or abnormalities  Lungs:   clear to auscultation bilaterally  Heart:   regular rate and rhythm, S1, S2 normal, no murmur, click, rub or gallop  Breasts:   normal without suspicious masses, skin or nipple changes or axillary nodes  Abdomen:  normal findings: no organomegaly, soft, non-tender and no hernia  Pelvis:  External genitalia: scarring left labia majus Urinary system: urethral meatus normal and bladder without fullness, nontender Vaginal: normal without tenderness, induration or masses Adnexa: normal bimanual exam      Assessment:    Healthy female exam.  Menopausal symptoms   Plan:    Education reviewed: calcium supplements, smoking cessation and weight bearing exercise.   Meds ordered this encounter  Medications  . estradiol (CLIMARA) 0.025 mg/24hr patch    Sig: Place  1 patch (0.025 mg total) onto the skin once a week.    Dispense:  8 patch    Refill:  3   Follow up in 6 mths

## 2013-10-19 NOTE — Patient Instructions (Signed)
Smoking Cessation Quitting smoking is important to your health and has many advantages. However, it is not always easy to quit since nicotine is a very addictive drug. Often times, people try 3 times or more before being able to quit. This document explains the best ways for you to prepare to quit smoking. Quitting takes hard work and a lot of effort, but you can do it. ADVANTAGES OF QUITTING SMOKING  You will live longer, feel better, and live better.  Your body will feel the impact of quitting smoking almost immediately.  Within 20 minutes, blood pressure decreases. Your pulse returns to its normal level.  After 8 hours, carbon monoxide levels in the blood return to normal. Your oxygen level increases.  After 24 hours, the chance of having a heart attack starts to decrease. Your breath, hair, and body stop smelling like smoke.  After 48 hours, damaged nerve endings begin to recover. Your sense of taste and smell improve.  After 72 hours, the body is virtually free of nicotine. Your bronchial tubes relax and breathing becomes easier.  After 2 to 12 weeks, lungs can hold more air. Exercise becomes easier and circulation improves.  The risk of having a heart attack, stroke, cancer, or lung disease is greatly reduced.  After 1 year, the risk of coronary heart disease is cut in half.  After 5 years, the risk of stroke falls to the same as a nonsmoker.  After 10 years, the risk of lung cancer is cut in half and the risk of other cancers decreases significantly.  After 15 years, the risk of coronary heart disease drops, usually to the level of a nonsmoker.  If you are pregnant, quitting smoking will improve your chances of having a healthy baby.  The people you live with, especially any children, will be healthier.  You will have extra money to spend on things other than cigarettes. QUESTIONS TO THINK ABOUT BEFORE ATTEMPTING TO QUIT You may want to talk about your answers with your  caregiver.  Why do you want to quit?  If you tried to quit in the past, what helped and what did not?  What will be the most difficult situations for you after you quit? How will you plan to handle them?  Who can help you through the tough times? Your family? Friends? A caregiver?  What pleasures do you get from smoking? What ways can you still get pleasure if you quit? Here are some questions to ask your caregiver:  How can you help me to be successful at quitting?  What medicine do you think would be best for me and how should I take it?  What should I do if I need more help?  What is smoking withdrawal like? How can I get information on withdrawal? GET READY  Set a quit date.  Change your environment by getting rid of all cigarettes, ashtrays, matches, and lighters in your home, car, or work. Do not let people smoke in your home.  Review your past attempts to quit. Think about what worked and what did not. GET SUPPORT AND ENCOURAGEMENT You have a better chance of being successful if you have help. You can get support in many ways.  Tell your family, friends, and co-workers that you are going to quit and need their support. Ask them not to smoke around you.  Get individual, group, or telephone counseling and support. Programs are available at local hospitals and health centers. Call your local health department for   information about programs in your area.  Spiritual beliefs and practices may help some smokers quit.  Download a "quit meter" on your computer to keep track of quit statistics, such as how long you have gone without smoking, cigarettes not smoked, and money saved.  Get a self-help book about quitting smoking and staying off of tobacco. [place] yourself from urges to smoke. Talk to someone, go for a walk, or occupy your time with a task.  Change your normal routine. Take a different route to work. Drink tea instead of coffee.  Eat breakfast in a different place.  Reduce your stress. Take a hot bath, exercise, or read a book.  Plan something enjoyable to do every day. Reward yourself for not smoking.  Explore interactive web-based programs that specialize in helping you quit. GET MEDICINE AND USE IT CORRECTLY Medicines can help you stop smoking and decrease the urge to smoke. Combining medicine with the above behavioral methods and support can greatly increase your chances of successfully quitting smoking.  Nicotine replacement therapy helps deliver nicotine to your body without the negative effects and risks of smoking. Nicotine replacement therapy includes nicotine gum, lozenges, inhalers, nasal sprays, and skin patches. Some may be available over-the-counter and others require a prescription.  Antidepressant medicine helps people abstain from smoking, but how this works is unknown. This medicine is available by prescription.  Nicotinic receptor partial agonist medicine simulates the effect of nicotine in your brain. This medicine is available by prescription. Ask your caregiver for advice about which medicines to use and how to use them based on your health history. Your caregiver will tell you what side effects to look out for if you choose to be on a medicine or therapy. Carefully read the information on the package. Do not use any other product containing nicotine while using a nicotine replacement product.  RELAPSE OR DIFFICULT SITUATIONS Most relapses occur within the first 3 months after quitting. Do not be discouraged if you start smoking again. Remember, most people try several times before finally quitting. You may have symptoms of withdrawal because your body is used to nicotine. You may crave cigarettes, be irritable, feel very hungry, cough often, get headaches, or have difficulty concentrating. The withdrawal symptoms are only temporary. They are strongest when you first quit, but they will go away within  10 14 days. To reduce the chances of relapse, try to:  Avoid drinking alcohol. Drinking lowers your chances of successfully quitting.  Reduce the amount of caffeine you consume. Once you quit smoking, the amount of caffeine in your body increases and can give you symptoms, such as a rapid heartbeat, sweating, and anxiety.  Avoid smokers because they can make you want to smoke.  Do not let weight gain distract you. Many smokers will gain weight when they quit, usually less than 10 pounds. Eat a healthy diet and stay active. You can always lose the weight gained after you quit.  Find ways to improve your mood other than smoking. FOR MORE INFORMATION  www.smokefree.gov  Document Released: 06/08/2001 Document Revised: 12/14/2011 Document Reviewed: 09/23/2011 [facility] Patient Information 2014 [place], [place]. Health Maintenance, Female A healthy lifestyle and preventative care can promote health and wellness.  Maintain regular health, dental, and eye exams.  Eat a healthy diet. Foods like vegetables, fruits, whole grains, low-fat dairy products, and lean protein foods contain the nutrients you need without too many calories. Decrease your intake of foods high in solid fats,  added sugars, and salt. Get information about a proper diet from your caregiver, if necessary.  Regular physical exercise is one of the most important things you can do for your health. Most adults should get at least 150 minutes of moderate-intensity exercise (any activity that increases your heart rate and causes you to sweat) each week. In addition, most adults need muscle-strengthening exercises on 2 or more days a week.   Maintain a healthy weight. The body mass index (BMI) is a screening tool to identify possible weight problems. It provides an estimate of body fat based on height and weight. Your caregiver can help determine your BMI, and can help you achieve or maintain a healthy weight. For adults 20 years and  older:  A BMI below 18.5 is considered underweight.  A BMI of 18.5 to 24.9 is normal.  A BMI of 25 to 29.9 is considered overweight.  A BMI of 30 and above is considered obese.  Maintain normal blood lipids and cholesterol by exercising and minimizing your intake of saturated fat. Eat a balanced diet with plenty of fruits and vegetables. Blood tests for lipids and cholesterol should begin at age 24 and be repeated every 5 years. If your lipid or cholesterol levels are high, you are over 50, or you are a high risk for heart disease, you may need your cholesterol levels checked more frequently.Ongoing high lipid and cholesterol levels should be treated with medicines if diet and exercise are not effective.  If you smoke, find out from your caregiver how to quit. If you do not use tobacco, do not start.  Lung cancer screening is recommended for adults aged 21 80 years who are at high risk for developing lung cancer because of a history of smoking. Yearly low-dose computed tomography (CT) is recommended for people who have at least a 30-pack-year history of smoking and are a current smoker or have quit within the past 15 years. A pack year of smoking is smoking an average of 1 pack of cigarettes a day for 1 year (for example: 1 pack a day for 30 years or 2 packs a day for 15 years). Yearly screening should continue until the smoker has stopped smoking for at least 15 years. Yearly screening should also be stopped for people who develop a health problem that would prevent them from having lung cancer treatment.  If you are pregnant, do not drink alcohol. If you are breastfeeding, be very cautious about drinking alcohol. If you are not pregnant and choose to drink alcohol, do not exceed 1 drink per day. One drink is considered to be 12 ounces (355 mL) of beer, 5 ounces (148 mL) of wine, or 1.5 ounces (44 mL) of liquor.  Avoid use of street drugs. Do not share needles with anyone. Ask for help if you  need support or instructions about stopping the use of drugs.  High blood pressure causes heart disease and increases the risk of stroke. Blood pressure should be checked at least every 1 to 2 years. Ongoing high blood pressure should be treated with medicines, if weight loss and exercise are not effective.  If you are 82 to 60 years old, ask your caregiver if you should take aspirin to prevent strokes.  Diabetes screening involves taking a blood sample to check your fasting blood sugar level. This should be done once every 3 years, after age 31, if you are within normal weight and without risk factors for diabetes. Testing should be considered at  a younger age or be carried out more frequently if you are overweight and have at least 1 risk factor for diabetes.  Breast cancer screening is essential preventative care for women. You should practice "breast self-awareness." This means understanding the normal appearance and feel of your breasts and may include breast self-examination. Any changes detected, no matter how small, should be reported to a caregiver. Women in their 43s and 30s should have a clinical breast exam (CBE) by a caregiver as part of a regular health exam every 1 to 3 years. After age 27, women should have a CBE every year. Starting at age 56, women should consider having a mammogram (breast X-ray) every year. Women who have a family history of breast cancer should talk to their caregiver about genetic screening. Women at a high risk of breast cancer should talk to their caregiver about having an MRI and a mammogram every year.  Breast cancer gene (BRCA)-related cancer risk assessment is recommended for women who have family members with BRCA-related cancers. BRCA-related cancers include breast, ovarian, tubal, and peritoneal cancers. Having family members with these cancers may be associated with an increased risk for harmful changes (mutations) in the breast cancer genes BRCA1 and BRCA2.  Results of the assessment will determine the need for genetic counseling and BRCA1 and BRCA2 testing.  The Pap test is a screening test for cervical cancer. Women should have a Pap test starting at age 68. Between ages 56 and 73, Pap tests should be repeated every 2 years. Beginning at age 8, you should have a Pap test every 3 years as long as the past 3 Pap tests have been normal. If you had a hysterectomy for a problem that was not cancer or a condition that could lead to cancer, then you no longer need Pap tests. If you are between ages 27 and 57, and you have had normal Pap tests going back 10 years, you no longer need Pap tests. If you have had past treatment for cervical cancer or a condition that could lead to cancer, you need Pap tests and screening for cancer for at least 20 years after your treatment. If Pap tests have been discontinued, risk factors (such as a new sexual partner) need to be reassessed to determine if screening should be resumed. Some women have medical problems that increase the chance of getting cervical cancer. In these cases, your caregiver may recommend more frequent screening and Pap tests.  The human papillomavirus (HPV) test is an additional test that may be used for cervical cancer screening. The HPV test looks for the virus that can cause the cell changes on the cervix. The cells collected during the Pap test can be tested for HPV. The HPV test could be used to screen women aged 30 years and older, and should be used in women of any age who have unclear Pap test results. After the age of 54, women should have HPV testing at the same frequency as a Pap test.  Colorectal cancer can be detected and often prevented. Most routine colorectal cancer screening begins at the age of 24 and continues through age 5. However, your caregiver may recommend screening at an earlier age if you have risk factors for colon cancer. On a yearly basis, your caregiver may provide home test kits  to check for hidden blood in the stool. Use of a small camera at the end of a tube, to directly examine the colon (sigmoidoscopy or colonoscopy), can detect the earliest  forms of colorectal cancer. Talk to your caregiver about this at age 97, when routine screening begins. Direct examination of the colon should be repeated every 5 to 10 years through age 42, unless early forms of pre-cancerous polyps or small growths are found.  Hepatitis C blood testing is recommended for all people born from [birth year] through 1967 and any individual with known risks for hepatitis C.  Practice safe sex. Use condoms and avoid high-risk sexual practices to reduce the spread of sexually transmitted infections (STIs). Sexually active women aged 55 and younger should be checked for Chlamydia, which is a common sexually transmitted infection. Older women with new or multiple partners should also be tested for Chlamydia. Testing for other STIs is recommended if you are sexually active and at increased risk.  Osteoporosis is a disease in which the bones lose minerals and strength with aging. This can result in serious bone fractures. The risk of osteoporosis can be identified using a bone density scan. Women ages 21 and over and women at risk for fractures or osteoporosis should discuss screening with their caregivers. Ask your caregiver whether you should be taking a calcium supplement or vitamin D to reduce the rate of osteoporosis.  Menopause can be associated with physical symptoms and risks. Hormone replacement therapy is available to decrease symptoms and risks. You should talk to your caregiver about whether hormone replacement therapy is right for you.  Use sunscreen. Apply sunscreen liberally and repeatedly throughout the day. You should seek shade when your shadow is shorter than you. Protect yourself by wearing long sleeves, pants, a wide-brimmed hat, and sunglasses year round, whenever you are outdoors.  Notify your  caregiver of new moles or changes in moles, especially if there is a change in shape or color. Also notify your caregiver if a mole is larger than the size of a pencil eraser.  Stay current with your immunizations. Document Released: 12/28/2010 Document Revised: 10/09/2012 Document Reviewed: 12/28/2010 [facility] Patient Information 2014 [place].

## 2013-11-25 IMAGING — US US ABDOMEN COMPLETE
1 series · 14 of 25 positions shown · non-contrast
Comparison: Barium enema 05/13/2011.

CLINICAL DATA: Abdominal pain.

COMPLETE ABDOMINAL ULTRASOUND

[Series 1: us abdomen complete · 0.28mm/px · 14 of 91 slices shown]
[im 1/91]
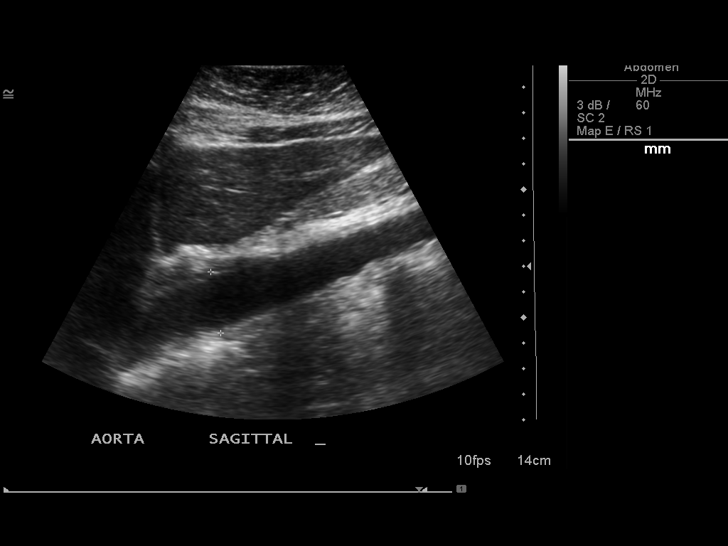
[im 8/91]
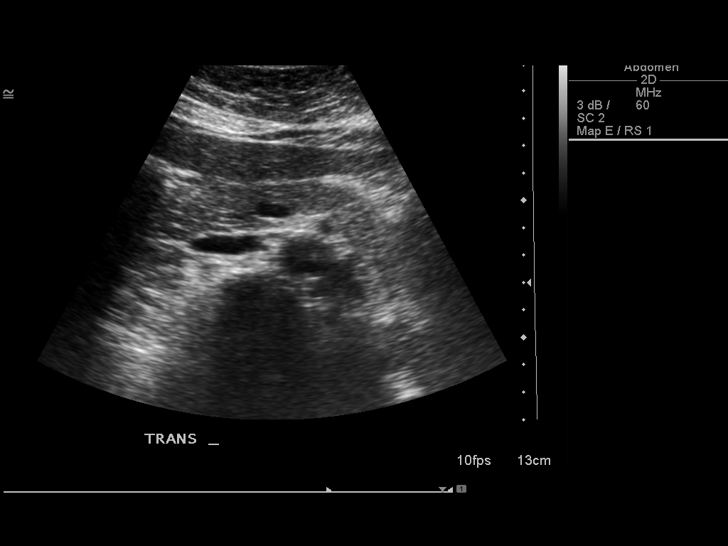
[im 16/91]
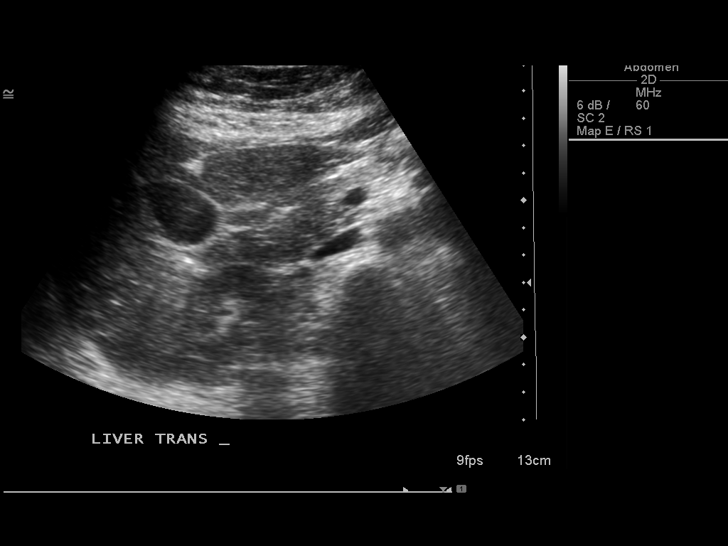
[im 23/91]
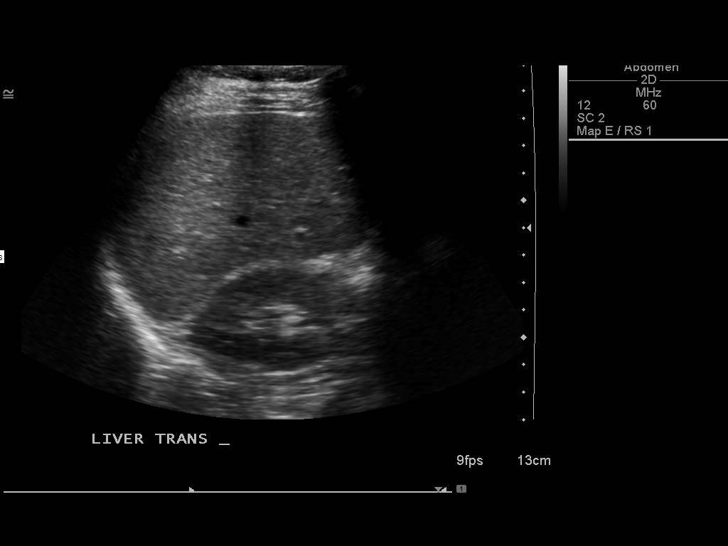
[im 31/91]
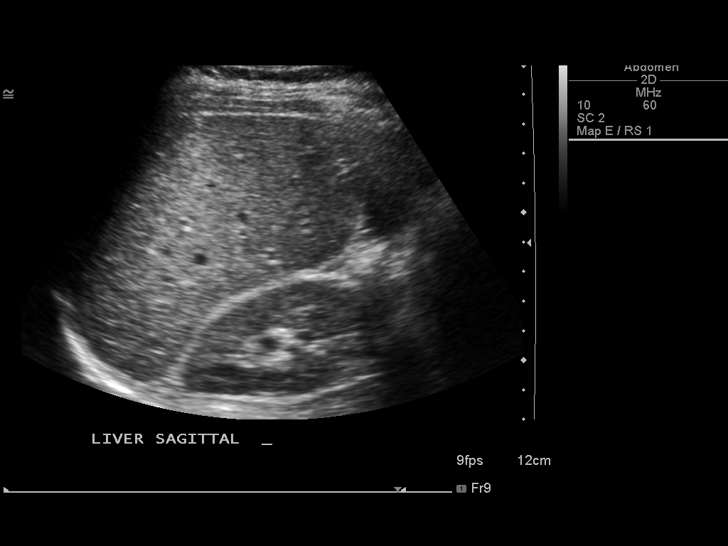
[im 34/91]
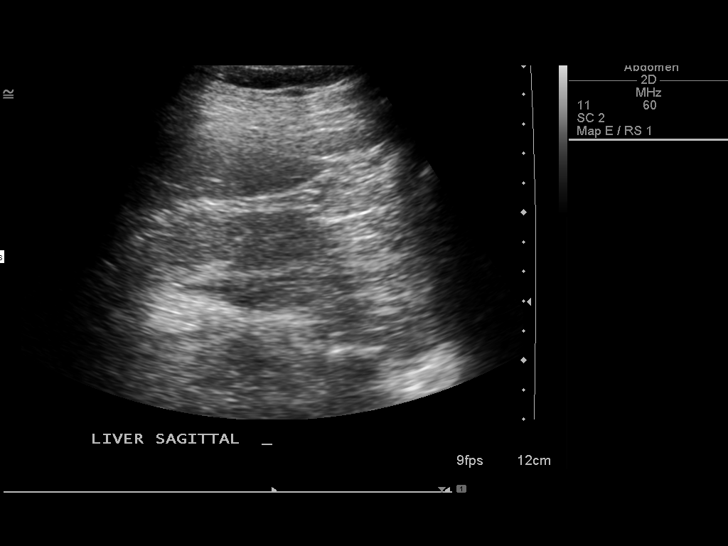
[im 42/91]
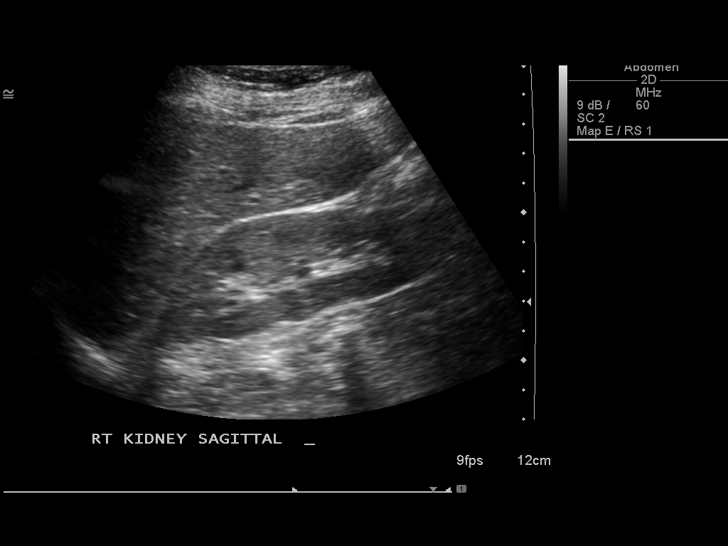
[im 49/91]
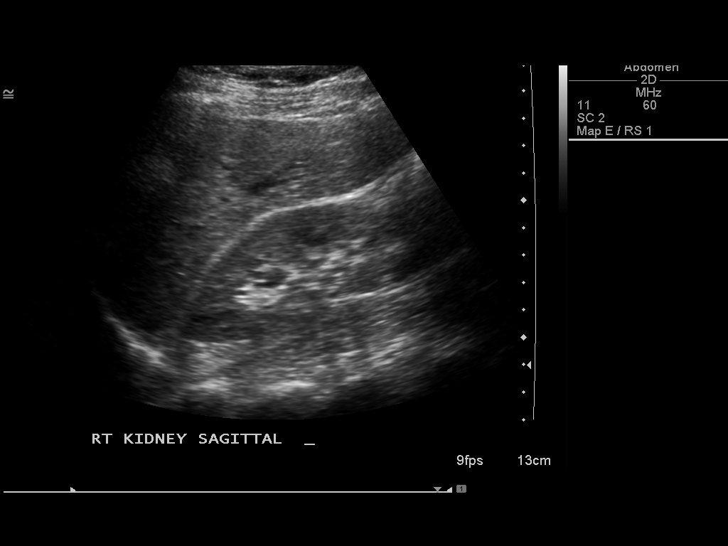
[im 57/91]
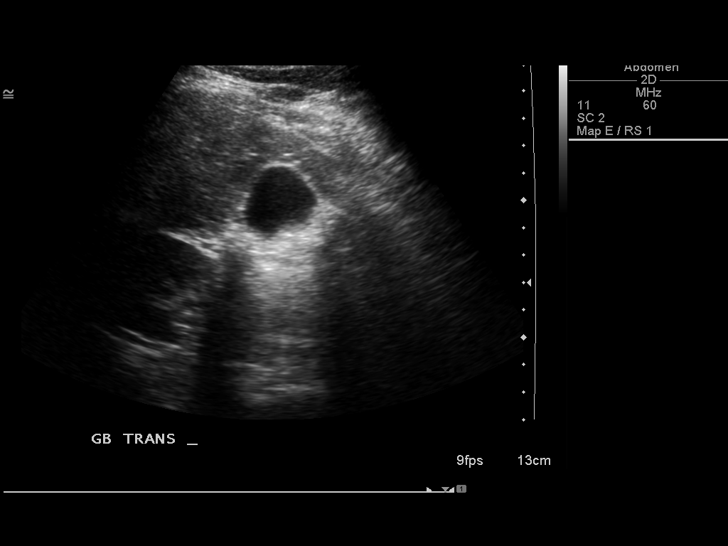
[im 61/91]
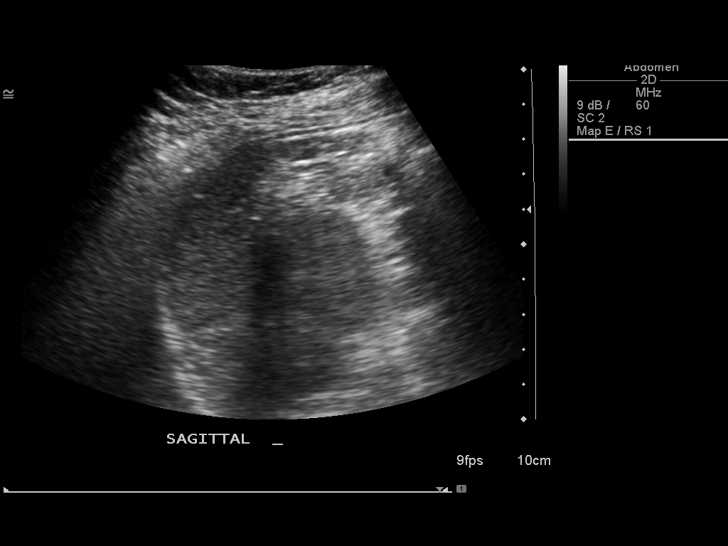
[im 68/91]
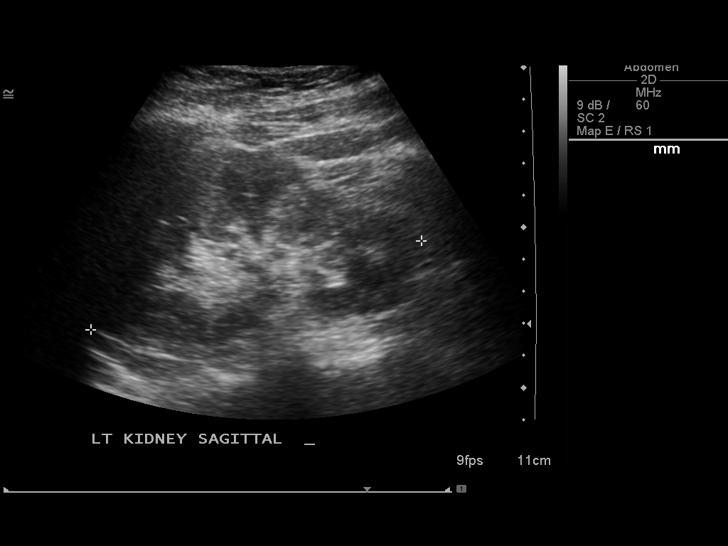
[im 76/91]
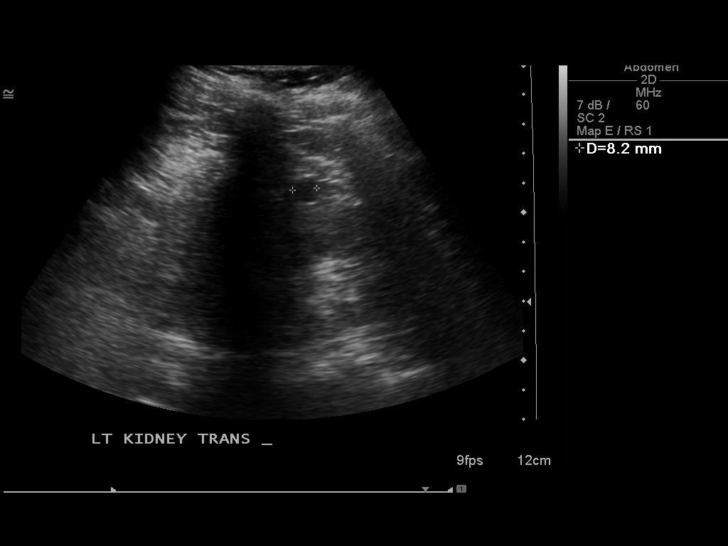
[im 83/91]
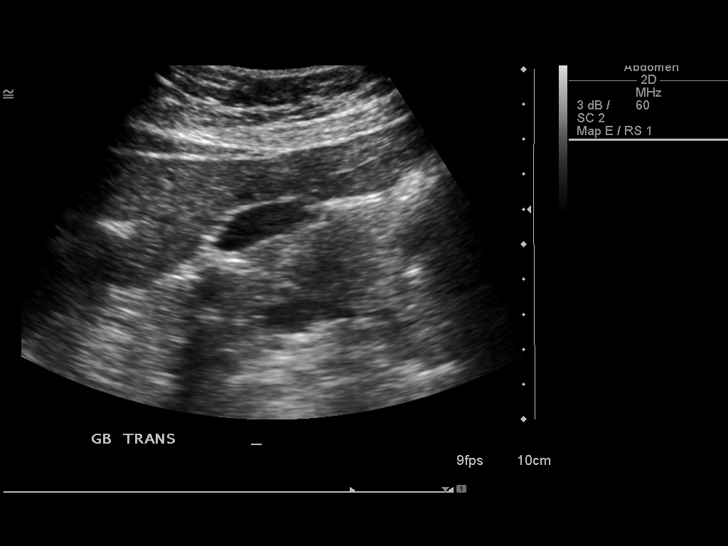
[im 91/91]
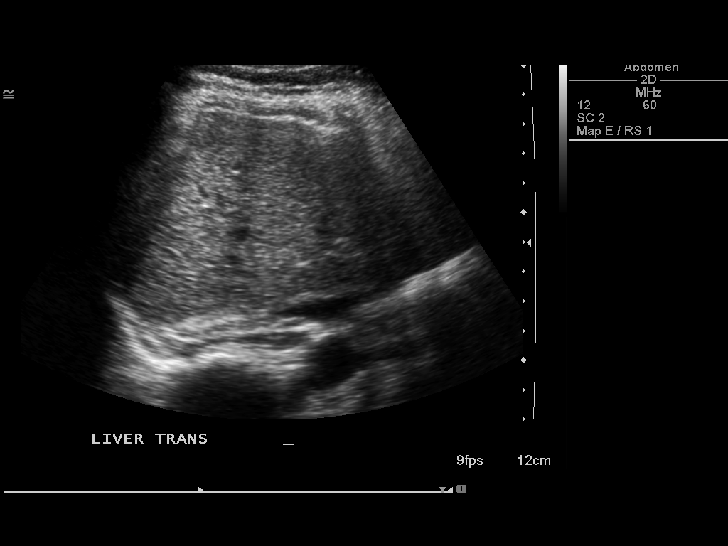

[14 of 25 positions shown; findings below may reference images not displayed]

FINDINGS: Gallbladder: Well distended without wall thickening, stones or
pericholecystic fluid.

Common bile duct:   Normal in caliber without filling defects.

Liver:  Echogenicity is within normal limits.  No focal hepatic
abnormalities are identified.

IVC:  Visualized portions appear unremarkable.

Pancreas:  Visualized portions appear unremarkable.

Spleen:  Visualized portions appear unremarkable.

Right Kidney:   The renal cortical thickness and echogenicity are
preserved.  There is no hydronephrosis or focal abnormality. Renal
length is 11.8 cm.

Left Kidney: There is a small cyst in the interpolar region
measuring 9 mm maximally.  The renal cortical thickness and
echogenicity are preserved.  There is no hydronephrosis or other
focal abnormality. Renal length is 10.7 cm.

Abdominal aorta:  Visualized portions appear unremarkable.
IMPRESSION: No acute or significant findings.  Small left renal cyst.

## 2014-04-18 ENCOUNTER — Ambulatory Visit: Payer: Medicare Other | Admitting: Obstetrics & Gynecology

## 2014-04-29 ENCOUNTER — Encounter: Payer: Self-pay | Admitting: Obstetrics & Gynecology

## 2014-06-24 ENCOUNTER — Encounter: Payer: Self-pay | Admitting: *Deleted

## 2014-06-25 ENCOUNTER — Encounter: Payer: Self-pay | Admitting: Obstetrics & Gynecology

## 2014-07-17 ENCOUNTER — Encounter: Payer: Self-pay | Admitting: Family Medicine

## 2014-07-17 ENCOUNTER — Ambulatory Visit (INDEPENDENT_AMBULATORY_CARE_PROVIDER_SITE_OTHER): Payer: Medicaid Other | Admitting: Family Medicine

## 2014-07-17 VITALS — BP 108/73 | HR 101 | Temp 98.5°F | Ht 63.0 in | Wt 141.2 lb

## 2014-07-17 DIAGNOSIS — Z720 Tobacco use: Secondary | ICD-10-CM | POA: Diagnosis not present

## 2014-07-17 DIAGNOSIS — L84 Corns and callosities: Secondary | ICD-10-CM

## 2014-07-17 DIAGNOSIS — L989 Disorder of the skin and subcutaneous tissue, unspecified: Secondary | ICD-10-CM | POA: Diagnosis not present

## 2014-07-17 DIAGNOSIS — F17200 Nicotine dependence, unspecified, uncomplicated: Secondary | ICD-10-CM

## 2014-07-17 MED ORDER — ESTRADIOL 0.025 MG/24HR TD PTWK: 0.0250 mg | MEDICATED_PATCH | TRANSDERMAL | Status: DC

## 2014-07-17 NOTE — Patient Instructions (Signed)
Thank you so much for coming to see me today!  I have refilled your Estradiol patches. They should be at your pharmacy.  Please schedule an appointment at the dermatology clinic here to have the mole on your right leg removed and sent for biopsy.  I have referred you to Podiatry for the calluses on your left foot.  Please schedule an appointment when you develop pain in your thighs again so it can be evaluated. You can use Aspercreme for pain relief.  I have given you a form concerning Mammograms. Please call to schedule an appointment.  Thanks again! Dr. Gerlean Ren

## 2014-07-18 DIAGNOSIS — L84 Corns and callosities: Secondary | ICD-10-CM | POA: Insufficient documentation

## 2014-07-18 DIAGNOSIS — L989 Disorder of the skin and subcutaneous tissue, unspecified: Secondary | ICD-10-CM | POA: Insufficient documentation

## 2014-07-18 NOTE — Assessment & Plan Note (Signed)
-   Raised mole noted on right lateral lower leg - Instructed to schedule appointment at the clinic or with the dermatology clinic for removal. Send for biopsy for diagnosis.

## 2014-07-18 NOTE — Progress Notes (Signed)
Subjective:     Patient ID: [name], female   DOB: December 27, 1955, 59 y.o.   MRN: [id number]  HPI [name] is a 59yo female presenting today to establish care. Current Concerns:  Notes occasional pain in thighs, which results in trouble walking  Occurs every few months and lasts 3-5 days  Also notes pain in right buttock with pain in thighs  Is not currently experiencing pain  Has been experiencing intermittently for more than a year  Has tried heat and Tylenol with some relief   Notes mole on right leg and callus on the heel of her left foot  First noticed mole 1-2 years ago. Does not appear to be changing.   Has been soaking her foot every day without relief  PMH: Anxiety, Depression, Arthritis (shoulders, knees), cancer (vulvar, in remission since 2009), chronic pain (legs), GERD   Meds: Aspirin 81mg , Multivitamin, Estradiol 0.025mg  patch weekly Ax: None  Sx: Hysterectomy, Vulvectomy  Fam Hx: depression/anxiety (mother, sister, brother), hyperlipidemia (mother, sister), HTN (mother, sister), stroke (mother), thyroid disease (sister)  Soc Hx: Divorced, high school graduate with some college, lives with her daughter [name], 16yo), owns car, does not exercise regularly, smokes 1/2 ppd and started in 1998, admits to occasional marijuana use with last use 3wk ago, denies alcohol use  Maintenance: Colonoscopy 2013 (next due in 2018), Mammogram (next due in spring), Pap Smear 2015  Review of Systems  Musculoskeletal: Positive for myalgias and arthralgias.  Psychiatric/Behavioral: Negative for dysphoric mood. The patient is not nervous/anxious.        Objective:   Physical Exam  Constitutional: She is oriented to person, place, and time. She appears well-developed and well-nourished. No distress.  HENT:  Head: Normocephalic and atraumatic.  Right Ear: External ear normal.  Left Ear: External ear normal.  Cardiovascular: Normal rate and regular rhythm.  Exam  reveals no gallop and no friction rub.   No murmur heard. Pulmonary/Chest: Effort normal and breath sounds normal. No respiratory distress. She has no wheezes. She has no rales.  Abdominal: Soft. Bowel sounds are normal. She exhibits no distension. There is no tenderness.  Musculoskeletal: Normal range of motion. She exhibits no edema or tenderness.  Neurological: She is alert and oriented to person, place, and time. No cranial nerve deficit.  Skin: No rash noted.  Raised mole noted on lateral right lower leg. Raised tender callus noted on heel of left foot.  Psychiatric: She has a normal mood and affect. Her behavior is normal.      Assessment:     Please refer to Problem List for Assessment.     Plan:     Please refer to Problem List for Plan.  Instructed to return when muscular symptoms return so they can be fully evaluated.

## 2014-07-18 NOTE — Assessment & Plan Note (Signed)
-   Smokes 1/2ppd since 1998 - Interested in Smoking Cessation - Continue to counsel on cessation. Refer to pharmacy for further counseling at next visit.

## 2014-07-18 NOTE — Assessment & Plan Note (Signed)
-   Significant raised and tender callus noted on left heel - Concerns that it may ulcerate if not addressed - Referral to Podiatry

## 2014-07-25 ENCOUNTER — Other Ambulatory Visit (HOSPITAL_COMMUNITY): Payer: Self-pay | Admitting: Obstetrics & Gynecology

## 2014-07-25 DIAGNOSIS — Z1231 Encounter for screening mammogram for malignant neoplasm of breast: Secondary | ICD-10-CM

## 2014-08-12 ENCOUNTER — Ambulatory Visit (HOSPITAL_COMMUNITY): Payer: Medicare Other

## 2014-08-16 ENCOUNTER — Ambulatory Visit (HOSPITAL_COMMUNITY)
Admission: RE | Admit: 2014-08-16 | Discharge: 2014-08-16 | Disposition: A | Discharge status: Home / Self Care | Payer: Medicare Other | Source: Ambulatory Visit | Attending: Obstetrics & Gynecology | Admitting: Obstetrics & Gynecology

## 2014-08-16 DIAGNOSIS — Z1231 Encounter for screening mammogram for malignant neoplasm of breast: Secondary | ICD-10-CM | POA: Diagnosis not present

## 2014-08-22 ENCOUNTER — Ambulatory Visit: Payer: Medicare Other | Attending: Gynecologic Oncology | Admitting: Gynecologic Oncology

## 2014-08-22 ENCOUNTER — Encounter: Payer: Self-pay | Admitting: Gynecologic Oncology

## 2014-08-22 VITALS — BP 142/85 | HR 69 | Temp 99.1°F | Resp 18 | Ht 63.0 in | Wt 139.5 lb

## 2014-08-22 DIAGNOSIS — L989 Disorder of the skin and subcutaneous tissue, unspecified: Secondary | ICD-10-CM

## 2014-08-22 DIAGNOSIS — C519 Malignant neoplasm of vulva, unspecified: Secondary | ICD-10-CM | POA: Diagnosis not present

## 2014-08-22 DIAGNOSIS — Z8544 Personal history of malignant neoplasm of other female genital organs: Secondary | ICD-10-CM | POA: Diagnosis not present

## 2014-08-22 NOTE — Patient Instructions (Signed)
I have placed a referral for dermatology.

## 2014-08-22 NOTE — Progress Notes (Signed)
Consult Note: Gyn-Onc  [name] 59 y.o. female  CC:  Chief Complaint  Patient presents with  . Vulvar Cancer    HPI: The patient is a 59 year old with multifocal vulvar carcinoma. She initially underwent a radical right vulvectomy and left-sided wide local excision with CO2 laser ablation, July 2009. 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 In June of that year, we performed re-excision that again showed invasive disease. She was lost to follow up from [place] since April 2012 but then I saw her in 6/13. Pap at that time negative. I have not seen her since. She saw Dr. Delsa Sale in April 2015. At that time her exam was unremarkable. She was prescribed Climara. She comes in today for follow-up.  Interval History:  Her last mammogram was in Feb 2016 it was negative.  She had a colonoscopy in February 2013. 2 polyps were removed. One was consistent with a tubular adenoma.  She's overall been doing fairly well. Her mother had a stroke in she's been very busy taking care of her. Her mother is at home but does require help with a lot of activities of daily living overall the patient is feeling fairly well she is occasionally tired. Occasionally her nerves feel like they're "on adjuvant she will smoke occasionally. For just over a year now she's noticed a lesion on the back of her right leg. She's not sure if this is a result of a bug bite and has not changed over time. She does occasionally complain of some shortness of breath which is going up stairs. She has some leg pain primarily in her thighs and it makes walking hard. She believes is secondary to arthritis she does endorse both urge as well as some stress urinary incontinence. We discussed the possibility  of having her referred to urology or urogynecology. She states that this point she can deal with it and would rather wait on a referral. She's had no vaginal bleeding she denies any vulvar lesions or pruritus.  Review of Systems  Constitutional: Feels occasionally tired.  Denies fever. Skin: + sore as above, + dry skin.  Cardiovascular: No chest pain, + shortness of breath with stairs, or edema  Pulmonary: No cough  Gastro Intestinal: No nausea, vomiting, constipation, or diarrhea reported. No bright red blood per rectum or change in bowel movement.  Genitourinary: No frequency, + urgency, or dysuria.  Denies vaginal bleeding and discharge.  Musculoskeletal: As above  Psychology: Reporting "feeling on edge".    Current Meds:  Outpatient Encounter Prescriptions as of 08/22/2014  Medication Sig  . aspirin 81 MG tablet Take 81 mg by mouth every morning.   Marland Kitchen estradiol (CLIMARA) 0.025 mg/24hr patch Place 1 patch (0.025 mg total) onto the skin once a week.    Allergy:  Allergies  Allergen Reactions  . Codeine Nausea Only    REACTION: Nausea    Social Hx:   History   Social History  . Marital Status: Divorced    Spouse Name: N/A  . Number of Children: N/A  . Years of Education: N/A   Occupational History  . Not on file.   Social History Main Topics  . Smoking status: Current Every Day Smoker -- 0.50 packs/day for 20 years  Types: Cigarettes  . Smokeless tobacco: Never Used  . Alcohol Use: No  . Drug Use: No  . Sexual Activity: No   Other Topics Concern  . Not on file   Social History Narrative    Past Surgical Hx:  Past Surgical History  Procedure Laterality Date  . Exc of vulvar cancer    . Abdominal hysterectomy    . Colonoscopy  08/03/2011    Procedure: COLONOSCOPY;  Surgeon: Wonda Horner, MD;  Location: [facility];  Service: Endoscopy;  Laterality: N/A;    Past Medical Hx:  Past Medical History  Diagnosis Date  . GERD (gastroesophageal reflux disease)    . Depression   . Complication of anesthesia   . PONV (postoperative nausea and vomiting)     Oncology Hx:    CARCINOMA, SKIN, SQUAMOUS CELL-RIGHT VULVA   10/03/2007 Initial Diagnosis CARCINOMA, SKIN, SQUAMOUS CELL-RIGHT VULVA   01/05/2008 Surgery right radical vulvectomy, left WLE with CO2 ablation. Stage I vulvar cancer, VIN3   12/20/2008 Relapse/Recurrence invasive SCCA    Family Hx:  Family History  Problem Relation Age of Onset  . Hypertension Mother   . Stroke Mother     Vitals:  Blood pressure 142/85, pulse 69, temperature 99.1 F (37.3 C), temperature source Oral, resp. rate 18, height 5\' 3"  (1.6 m), weight 139 lb 8 oz (63.277 kg), SpO2 100 %.  Physical Exam: Well-nourished well-developed female in no acute distress.  Neck: Supple, no lymphadenopathy, no thyromegaly.  Lungs: Clear to auscultation bilateral.  Cardiovascular: Regular rate and rhythm.  Abdomen: Well-healed vertical midline incision. Abdomen is soft, nontender, nondistended. There are no palpable masses or hepatosplenomegaly.  Groins: No lymphadenopathy.  Extremities: No edema. On the lateral aspect going towards the back of her calf on the right there is approximately 7-8 mm raised dark lesion. It appears most consistent with a keloid. The edges are very smooth. The color is uniform.  Pelvic: Normal external genitalia status post vulvar excision. There's no mucosal lesions. There's no lesions on the skin. There is a sebaceous cyst on the inner right thigh. Speculum examination reveals an 8 dramatic vagina. There is no gross visible lesions. The vaginal cuff is without lesions or discharge. Bimanual examination reveals no masses or nodularity.  Assessment/Plan: 59 year old with the original diagnosis of vulvar cancer in 2009 who had recurrent disease in 2010 and has had no evidence of disease since that time. We have not seen her since June 2013 and she comes in to reestablish care. We will refer her to  dermatology for evaluation of the lesion on her leg. We will try to help identify a primary care physician. She return to see [place] when necessary or in one year.   Barret Esquivel A., MD 08/22/2014, 2:20 PM

## 2015-05-16 DIAGNOSIS — M19071 Primary osteoarthritis, right ankle and foot: Secondary | ICD-10-CM | POA: Diagnosis not present

## 2015-05-16 DIAGNOSIS — M19072 Primary osteoarthritis, left ankle and foot: Secondary | ICD-10-CM | POA: Diagnosis not present

## 2016-01-09 DIAGNOSIS — M130 Polyarthritis, unspecified: Secondary | ICD-10-CM | POA: Diagnosis not present

## 2016-01-09 DIAGNOSIS — R634 Abnormal weight loss: Secondary | ICD-10-CM | POA: Diagnosis not present

## 2016-01-09 DIAGNOSIS — Z13220 Encounter for screening for lipoid disorders: Secondary | ICD-10-CM | POA: Diagnosis not present

## 2016-01-09 DIAGNOSIS — F064 Anxiety disorder due to known physiological condition: Secondary | ICD-10-CM | POA: Diagnosis not present

## 2016-01-09 DIAGNOSIS — Z79899 Other long term (current) drug therapy: Secondary | ICD-10-CM | POA: Diagnosis not present

## 2016-01-09 DIAGNOSIS — F3289 Other specified depressive episodes: Secondary | ICD-10-CM | POA: Diagnosis not present

## 2016-01-20 IMAGING — CR DG CHEST 2V
2 series · 2 of 2 positions shown · non-contrast
Comparison: [DATE]

CLINICAL DATA: Cough for 3-4 days

EXAM:
CHEST  2 VIEW

[view not recorded (1 of 2)]
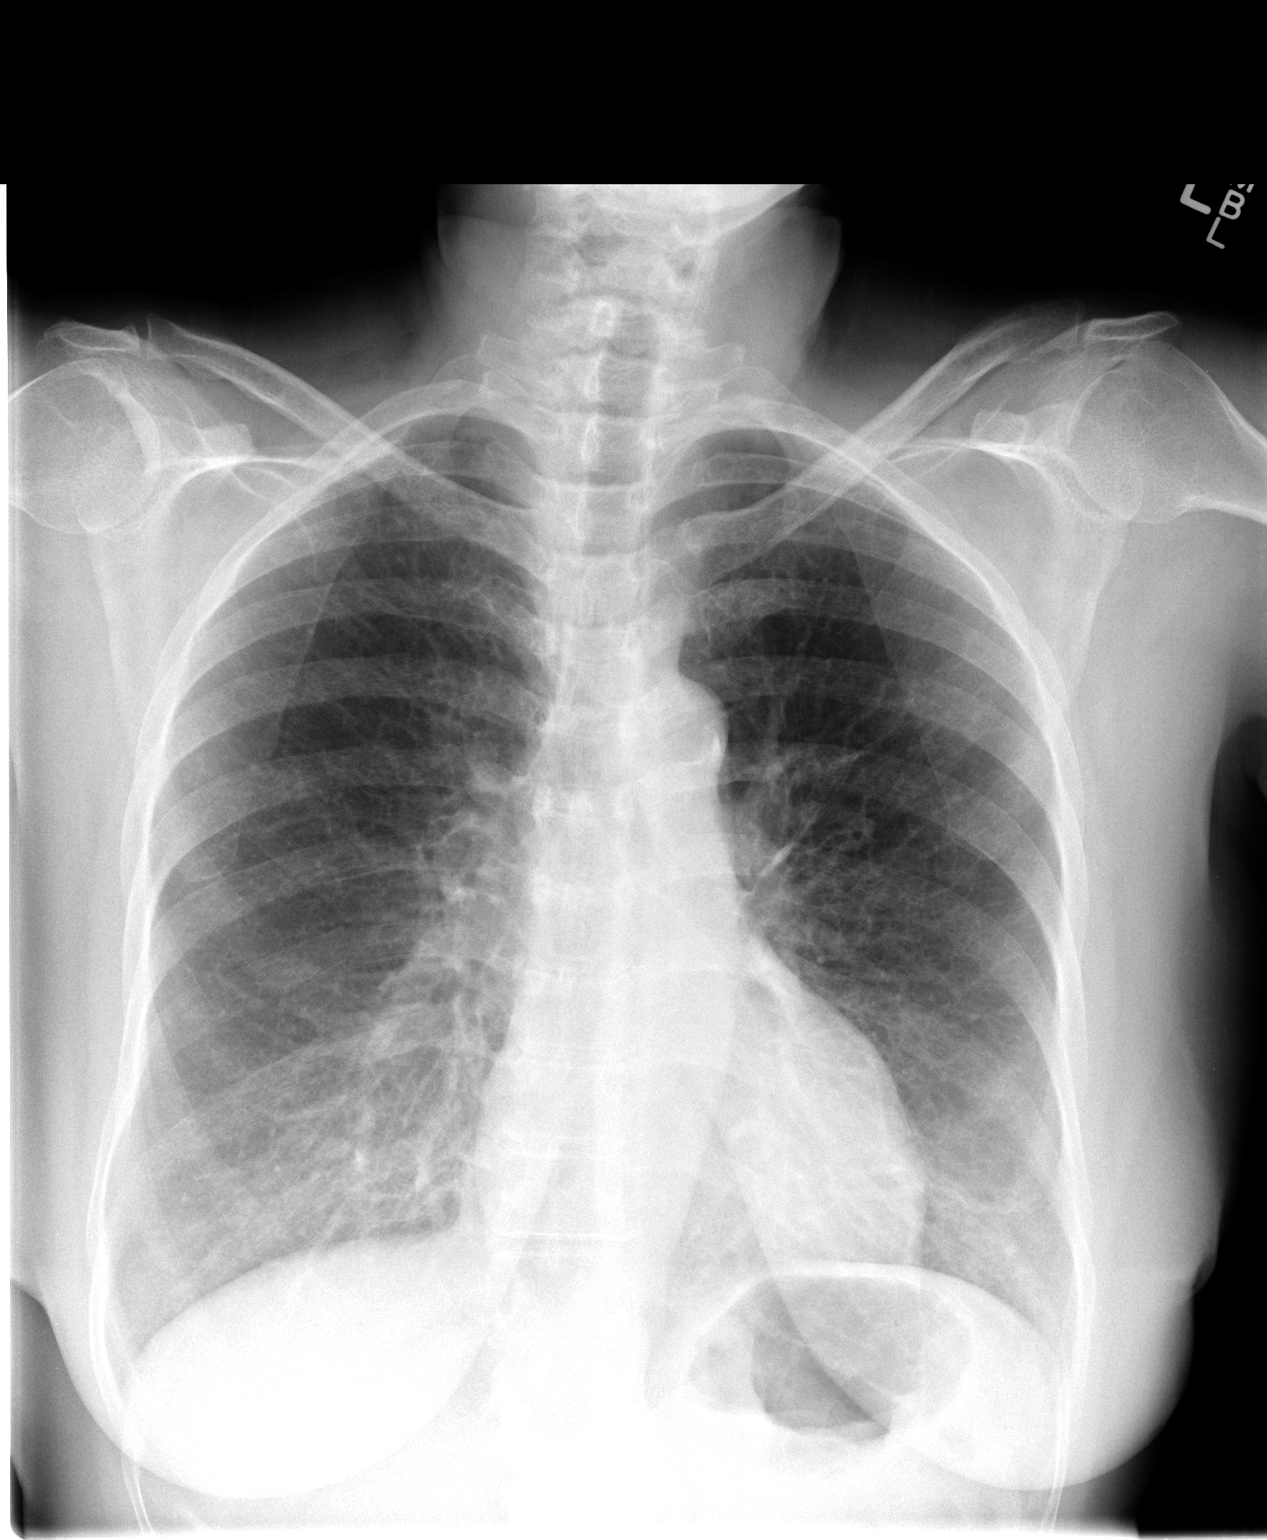

[view not recorded (2 of 2)]
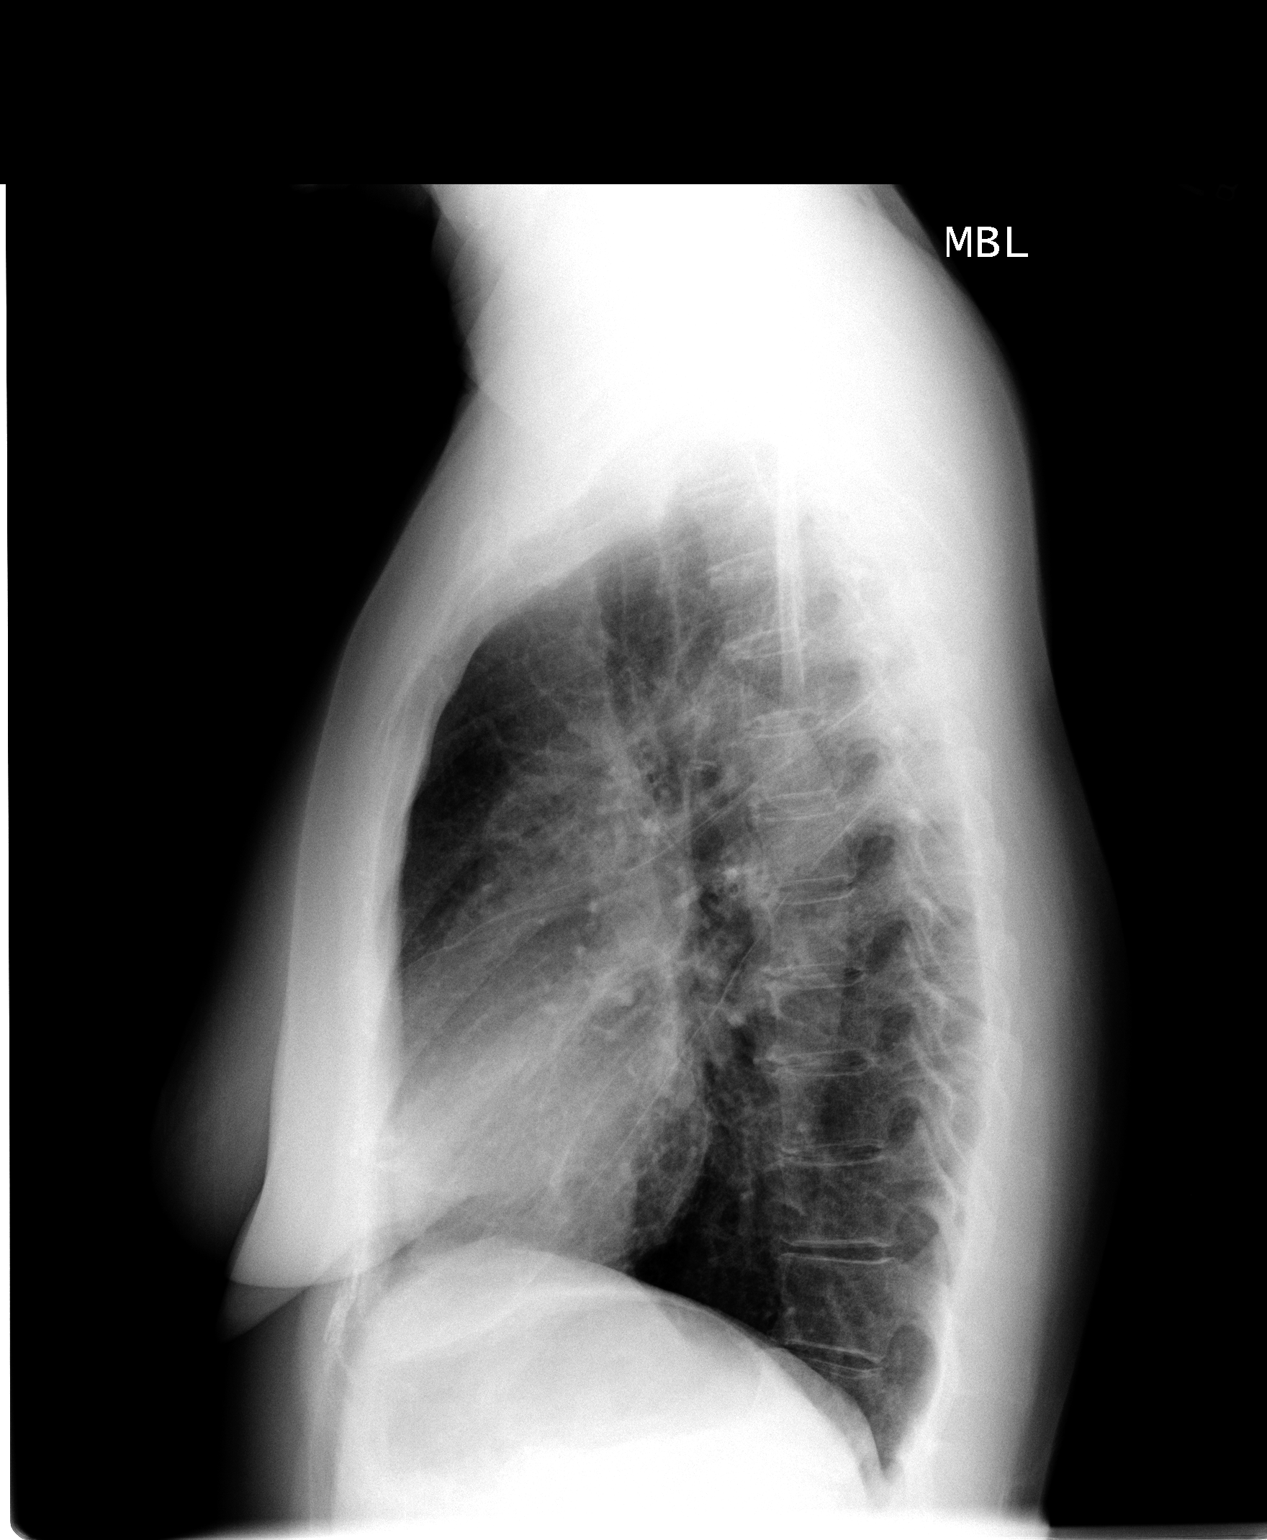

[2 of 2 positions shown; findings below may reference images not displayed]

FINDINGS: Heart size is normal. No pleural effusion or edema. Atelectasis
versus scar is noted in the left base. No airspace consolidation.
Coarsened interstitial markings are noted bilaterally. The
visualized skeletal structures appear normal.
IMPRESSION: 1. Left base scar versus atelectasis.

## 2016-01-26 DIAGNOSIS — Z6823 Body mass index (BMI) 23.0-23.9, adult: Secondary | ICD-10-CM | POA: Diagnosis not present

## 2016-01-26 DIAGNOSIS — M130 Polyarthritis, unspecified: Secondary | ICD-10-CM | POA: Diagnosis not present

## 2016-01-26 DIAGNOSIS — F3289 Other specified depressive episodes: Secondary | ICD-10-CM | POA: Diagnosis not present

## 2016-01-26 DIAGNOSIS — F064 Anxiety disorder due to known physiological condition: Secondary | ICD-10-CM | POA: Diagnosis not present

## 2016-09-29 ENCOUNTER — Ambulatory Visit: Payer: Medicare Other | Admitting: Family Medicine

## 2017-09-19 ENCOUNTER — Telehealth: Payer: Self-pay

## 2017-09-19 NOTE — Telephone Encounter (Signed)
ERROR

## 2017-11-22 ENCOUNTER — Encounter: Payer: Self-pay | Admitting: Family Medicine

## 2018-09-06 ENCOUNTER — Other Ambulatory Visit: Payer: Self-pay | Admitting: Internal Medicine

## 2018-09-06 DIAGNOSIS — Z79899 Other long term (current) drug therapy: Secondary | ICD-10-CM | POA: Diagnosis not present

## 2018-09-06 DIAGNOSIS — R232 Flushing: Secondary | ICD-10-CM | POA: Diagnosis not present

## 2018-09-06 DIAGNOSIS — Z1231 Encounter for screening mammogram for malignant neoplasm of breast: Secondary | ICD-10-CM

## 2018-09-25 DIAGNOSIS — R232 Flushing: Secondary | ICD-10-CM | POA: Diagnosis not present

## 2018-09-25 DIAGNOSIS — L84 Corns and callosities: Secondary | ICD-10-CM | POA: Diagnosis not present

## 2018-09-25 DIAGNOSIS — M5416 Radiculopathy, lumbar region: Secondary | ICD-10-CM | POA: Diagnosis not present

## 2018-10-04 ENCOUNTER — Ambulatory Visit: Payer: Medicare Other

## 2018-10-13 ENCOUNTER — Telehealth: Payer: Self-pay | Admitting: *Deleted

## 2018-10-13 NOTE — Telephone Encounter (Signed)
Called and spoke with the patient regarding her referral. Patient was to re-establish care. Patient ask that we call her back in June.

## 2018-10-23 DIAGNOSIS — L02439 Carbuncle of limb, unspecified: Secondary | ICD-10-CM | POA: Diagnosis not present

## 2018-11-22 ENCOUNTER — Telehealth: Payer: Self-pay | Admitting: *Deleted

## 2018-11-22 ENCOUNTER — Ambulatory Visit: Payer: Medicare Other

## 2018-11-22 NOTE — Telephone Encounter (Signed)
Patient scheduled to re-establish of care, scheduled for 7/28 with Dr. Alycia Rossetti

## 2019-01-02 ENCOUNTER — Telehealth: Payer: Self-pay | Admitting: *Deleted

## 2019-01-02 NOTE — Telephone Encounter (Signed)
Patient called for her date and time of her appt on 7/28

## 2019-01-22 NOTE — Progress Notes (Signed)
Consult Note: Gyn-Onc  [name] 63 y.o. female  CC:  Chief Complaint  Patient presents with  . Vulvar cancer [facility])    HPI: Patient is seen today in consultation at the request of Mila Palmer.    The patient is a 63 year old with multifocal vulvar carcinoma. She initially underwent a radical right vulvectomy and left-sided wide local excision with CO2 laser ablation, July 2009. 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 In June 2010, we performed re-excision that again showed invasive disease. She was lost to follow up from [place] since April 2012 but then I saw her in 6/13. Pap at that time negative. She saw Dr. Delsa Sale in April 2015. At that time her exam was unremarkable.   Interval History:  I last saw her 07/2014.  They suggested that she have follow-up with GYN oncology to reestablish care.  She is otherwise been doing fine has not had any complaints whatsoever.  She states she is been very busy this year helping her take care of her mom.  Her mom is had 3 strokes in this past year.  These were all before the [place] pandemic.  She is also been helping her brother who was diagnosed with prostate cancer and underwent radiation therapy and she helps take care of her sister has some mental issues.  She is otherwise doing fairly well she occasionally has some constipation which she manages with prune juice.  She states she is due for both a colonoscopy and a mammogram and her primary care physician is can help her arrange these.  She does complain of some occasional right hip and left-sided back pain.  She does experience 2-3 times a month some bilateral thigh and knee pain which is intermittent.  It will last a few days.  She uses Tylenol and a heating  pad and that helps.   Review of Systems: Constitutional: Denies fever.  Had COVID test earlier this month and it was negative Skin: No rash Cardiovascular: No chest pain, shortness of breath, or edema  Pulmonary: No cough  Gastro Intestinal: No nausea, vomiting, occ. constipation, no diarrhea reported. No bright red blood per rectum or change in bowel movement.  Genitourinary: Denies vaginal bleeding and discharge.  Musculoskeletal: As above Psychology: + stress  Current Meds:  Outpatient Encounter Medications as of 01/23/2019  Medication Sig  . aspirin 81 MG tablet Take 81 mg by mouth every morning.   Marland Kitchen estradiol (CLIMARA) 0.025 mg/24hr patch Place 1 patch (0.025 mg total) onto the skin once a week.  . diclofenac sodium (VOLTAREN) 1 % GEL APPLY 4 GRAMS TO THE AFFECTED AREA(S) BY TOPICAL ROUTE 4 TIMES PER DAY FOR 30 DAYS  . omeprazole (PRILOSEC) 20 MG capsule    No facility-administered encounter medications on file as of 01/23/2019.     Allergy:  Allergies  Allergen Reactions  . Codeine Nausea Only    REACTION: Nausea    Social Hx:   Social History   Socioeconomic History  . Marital status: Divorced    Spouse name: Not on file  . Number of children: Not on file  . Years of education: Not on file  . Highest education level: Not on file  Occupational History  . Not on file  Social Needs  .  Financial resource strain: Not on file  . Food insecurity    Worry: Not on file    Inability: Not on file  . Transportation needs    Medical: Not on file    Non-medical: Not on file  Tobacco Use  . Smoking status: Current Every Day Smoker    Packs/day: 0.50    Years: 20.00    Pack years: 10.00    Types: Cigarettes  . Smokeless tobacco: Never Used  Substance and Sexual Activity  . Alcohol use: No  . Drug use: Yes    Types: Marijuana  . Sexual activity: Not Currently  Lifestyle  . Physical activity    Days per week: Not on file    Minutes per session: Not on file  .  Stress: Not on file  Relationships  . Social Herbalist on phone: Not on file    Gets together: Not on file    Attends religious service: Not on file    Active member of club or organization: Not on file    Attends meetings of clubs or organizations: Not on file    Relationship status: Not on file  . Intimate partner violence    Fear of current or ex partner: Not on file    Emotionally abused: Not on file    Physically abused: Not on file    Forced sexual activity: Not on file  Other Topics Concern  . Not on file  Social History Narrative  . Not on file    Past Surgical Hx:  Past Surgical History:  Procedure Laterality Date  . ABDOMINAL HYSTERECTOMY    . COLONOSCOPY  08/03/2011   Procedure: COLONOSCOPY;  Surgeon: Wonda Horner, MD;  Location: [facility];  Service: Endoscopy;  Laterality: N/A;  . exc of vulvar cancer  12/2007    Past Medical Hx:  Past Medical History:  Diagnosis Date  . Complication of anesthesia   . Depression   . GERD (gastroesophageal reflux disease)   . PONV (postoperative nausea and vomiting)     Oncology Hx:  Oncology History  CARCINOMA, SKIN, SQUAMOUS CELL-RIGHT VULVA  10/03/2007 Initial Diagnosis   CARCINOMA, SKIN, SQUAMOUS CELL-RIGHT VULVA   01/05/2008 Surgery   right radical vulvectomy, left WLE with CO2 ablation. Stage I vulvar cancer, VIN3   12/20/2008 Relapse/Recurrence   invasive SCCA     Family Hx:  Family History  Problem Relation Age of Onset  . Hypertension Mother   . Stroke Mother   . Prostate cancer Brother     Vitals:  Blood pressure 114/78, pulse 68, temperature 98.9 F (37.2 C), temperature source Oral, resp. rate 18, height 5\' 3"  (1.6 m), weight 128 lb 8 oz (58.3 kg), SpO2 100 %.  Physical Exam: Well-nourished well-developed female in no acute distress.  Abdomen: Soft, nontender, nondistended.  No palpable mass hepatosplenomegaly.  Groins: No lymphadenopathy.  Extremities: No edema.  Pelvic: External  genitalia notable for surgical excision site.  There is no visible lesions or evidence of recurrent vulvar cancer.  On speculum examination the vagina is markedly atrophic.  The vaginal cuff is visualized.  There are no visible lesions.  ThinPrep Pap is submitted without difficulty.  Bimanual examination reveals no masses or nodularity.  Assessment/Plan:  63 year old with a distant history of vulvar cancer with her last recurrence in 2010.  She has not had any evidence of recurrent disease since that time.  We have only intermittently seen her since her diagnosis back in 2010.  At this time she does not have any evidence of recurrence and is 10 years out from her last evidence of recurrence.  I do not believe that she needs GYN oncology follow-up at this time I believe she can be seen in OB/GYN clinic and she can be referred should she have a recurrence.  I will follow-up on results of her Pap smear from today.  She is aware that she is due for colonoscopy and mammogram and will take the responsibility of ensuring that these are scheduled and that she attends those visits.  We appreciate the opportunity to part in the care of this very pleasant patient. Aily Tzeng A., MD 01/23/2019, 1:52 PM

## 2019-01-23 ENCOUNTER — Other Ambulatory Visit: Payer: Self-pay | Admitting: Gynecologic Oncology

## 2019-01-23 ENCOUNTER — Other Ambulatory Visit (HOSPITAL_COMMUNITY)
Admission: RE | Admit: 2019-01-23 | Discharge: 2019-01-23 | Disposition: A | Discharge status: Home / Self Care | Payer: Medicare Other | Source: Ambulatory Visit | Attending: Gynecologic Oncology | Admitting: Gynecologic Oncology

## 2019-01-23 ENCOUNTER — Other Ambulatory Visit: Payer: Self-pay

## 2019-01-23 ENCOUNTER — Inpatient Hospital Stay: Payer: Medicare Other | Attending: Gynecologic Oncology | Admitting: Gynecologic Oncology

## 2019-01-23 ENCOUNTER — Encounter: Payer: Self-pay | Admitting: Gynecologic Oncology

## 2019-01-23 VITALS — BP 114/78 | HR 68 | Temp 98.9°F | Resp 18 | Ht 63.0 in | Wt 128.5 lb

## 2019-01-23 DIAGNOSIS — F17210 Nicotine dependence, cigarettes, uncomplicated: Secondary | ICD-10-CM | POA: Insufficient documentation

## 2019-01-23 DIAGNOSIS — F329 Major depressive disorder, single episode, unspecified: Secondary | ICD-10-CM | POA: Insufficient documentation

## 2019-01-23 DIAGNOSIS — Z90710 Acquired absence of both cervix and uterus: Secondary | ICD-10-CM | POA: Diagnosis not present

## 2019-01-23 DIAGNOSIS — K219 Gastro-esophageal reflux disease without esophagitis: Secondary | ICD-10-CM | POA: Diagnosis not present

## 2019-01-23 DIAGNOSIS — Z9079 Acquired absence of other genital organ(s): Secondary | ICD-10-CM | POA: Diagnosis not present

## 2019-01-23 DIAGNOSIS — C519 Malignant neoplasm of vulva, unspecified: Secondary | ICD-10-CM | POA: Diagnosis not present

## 2019-01-23 DIAGNOSIS — Z08 Encounter for follow-up examination after completed treatment for malignant neoplasm: Secondary | ICD-10-CM

## 2019-01-23 NOTE — Patient Instructions (Signed)
We will notify you of the results of your Pap smear from today.  Fortunately there is no evidence of any cancer recurrence.  I do not feel like you need to have follow-up by GYN oncology but can be followed up by an OB/GYN.  Please let us know if we can be of any assistance to you in the future.

## 2019-01-23 NOTE — Addendum Note (Signed)
Addended by: Baruch Merl on: 01/23/2019 02:28 PM   Modules accepted: Orders

## 2019-01-29 LAB — CYTOLOGY - PAP: Diagnosis: NEGATIVE

## 2019-01-30 ENCOUNTER — Telehealth: Payer: Self-pay

## 2019-01-30 NOTE — Telephone Encounter (Signed)
Called to let [name] know that her pap smear was negative for malignancy. Could not leave a message as her voice mail is full on her phone.

## 2019-02-01 NOTE — Telephone Encounter (Signed)
Pt notified about pap results: negative.  No questions or concerns voiced. 

## 2019-02-01 NOTE — Telephone Encounter (Signed)
Called to let [name] know that her pap smear was negative for malignancy. Could not leave a message as her voice mail is full on her phone.

## 2019-04-10 ENCOUNTER — Other Ambulatory Visit: Payer: Self-pay | Admitting: Family Medicine

## 2019-08-06 DIAGNOSIS — Z79899 Other long term (current) drug therapy: Secondary | ICD-10-CM | POA: Diagnosis not present

## 2019-08-06 DIAGNOSIS — L84 Corns and callosities: Secondary | ICD-10-CM | POA: Diagnosis not present

## 2019-08-06 DIAGNOSIS — K219 Gastro-esophageal reflux disease without esophagitis: Secondary | ICD-10-CM | POA: Diagnosis not present

## 2019-08-06 DIAGNOSIS — R42 Dizziness and giddiness: Secondary | ICD-10-CM | POA: Diagnosis not present

## 2019-08-06 DIAGNOSIS — Z1159 Encounter for screening for other viral diseases: Secondary | ICD-10-CM | POA: Diagnosis not present

## 2019-09-11 ENCOUNTER — Ambulatory Visit: Payer: Medicare Other | Admitting: Cardiovascular Disease

## 2019-09-24 ENCOUNTER — Encounter: Payer: Self-pay | Admitting: General Practice

## 2020-05-28 DIAGNOSIS — E785 Hyperlipidemia, unspecified: Secondary | ICD-10-CM

## 2020-05-28 HISTORY — DX: Hyperlipidemia, unspecified: E78.5

## 2020-07-23 ENCOUNTER — Encounter: Payer: Self-pay | Admitting: General Practice

## 2020-08-12 ENCOUNTER — Other Ambulatory Visit: Payer: Self-pay | Admitting: Student

## 2020-08-27 ENCOUNTER — Other Ambulatory Visit: Payer: Self-pay

## 2020-08-27 ENCOUNTER — Encounter: Payer: Self-pay | Admitting: Cardiology

## 2020-08-27 ENCOUNTER — Ambulatory Visit (INDEPENDENT_AMBULATORY_CARE_PROVIDER_SITE_OTHER): Payer: Medicare Other | Admitting: Cardiology

## 2020-08-27 VITALS — BP 130/60 | HR 73 | Ht 63.0 in | Wt 132.8 lb

## 2020-08-27 DIAGNOSIS — R002 Palpitations: Secondary | ICD-10-CM | POA: Diagnosis not present

## 2020-08-27 DIAGNOSIS — R072 Precordial pain: Secondary | ICD-10-CM

## 2020-08-27 DIAGNOSIS — R079 Chest pain, unspecified: Secondary | ICD-10-CM | POA: Diagnosis not present

## 2020-08-27 DIAGNOSIS — R012 Other cardiac sounds: Secondary | ICD-10-CM

## 2020-08-27 DIAGNOSIS — I739 Peripheral vascular disease, unspecified: Secondary | ICD-10-CM | POA: Diagnosis not present

## 2020-08-27 DIAGNOSIS — R0600 Dyspnea, unspecified: Secondary | ICD-10-CM | POA: Diagnosis not present

## 2020-08-27 DIAGNOSIS — F17200 Nicotine dependence, unspecified, uncomplicated: Secondary | ICD-10-CM

## 2020-08-27 DIAGNOSIS — E785 Hyperlipidemia, unspecified: Secondary | ICD-10-CM | POA: Diagnosis not present

## 2020-08-27 DIAGNOSIS — R0609 Other forms of dyspnea: Secondary | ICD-10-CM | POA: Insufficient documentation

## 2020-08-27 MED ORDER — METOPROLOL TARTRATE 50 MG PO TABS: 100.0000 mg | ORAL_TABLET | Freq: Once | ORAL | 0 refills | Status: DC

## 2020-08-27 MED ORDER — METOPROLOL SUCCINATE ER 25 MG PO TB24
25.0000 mg | ORAL_TABLET | Freq: Every day | ORAL | 3 refills | Status: DC
Start: 2020-08-27 — End: 2021-11-04

## 2020-08-27 MED ORDER — ATORVASTATIN CALCIUM 40 MG PO TABS: 40.0000 mg | ORAL_TABLET | Freq: Every day | ORAL | 3 refills | Status: DC

## 2020-08-27 NOTE — Progress Notes (Signed)
Primary Care Provider: Cipriano Mile, NP Cardiologist: No primary care provider on file. Electrophysiologist: None  Podiatrist: Dr. Fritzi Mandes Vascular Surgeon: Dr. Stanford Breed (pending visit)  Clinic Note: Chief Complaint  Patient presents with  . New Patient (Initial Visit)    Here for chest pain and palpitations  . Chest Pain    Referred back to December 2021 visit PCP.   ===================================  ASSESSMENT/PLAN   Problem List Items Addressed This Visit    Exertional chest pain - Primary    She has this fullness sensation in her chest that happens at rest with a bubbling sensation that sounds like it very well could be GERD, but she does also has a tightness sensation with exertion.  Could be 2 different symptoms.  She clearly has CAD risk equivalent with PAD noted on Dopplers.  She is a smoker with hyperlipidemia and prediabetes and therefore carries significant risk for CAD.  Plan:   We will start low-dose metoprolol 25 mg twice daily and increase atorvastatin dose to 40 mg daily.   Also recommend continuing aspirin 81 mg.  Cardiac CT Angiogram to assess baseline CAD as well as for any ischemia with CT FFR if indicated. Prefer Coronary/Cardiac CTA over my view because it allows [place] to assess for underlying CAD not seen by [facility].      Relevant Orders   EKG 12-Lead (Completed)   CT CORONARY MORPH W/CTA COR W/SCORE W/CA W/CM &/OR WO/CM   CT CORONARY FRACTIONAL FLOW RESERVE DATA PREP   CT CORONARY FRACTIONAL FLOW RESERVE FLUID ANALYSIS   Basic metabolic panel   ECHOCARDIOGRAM COMPLETE   Claudication in peripheral vascular disease ([place])    Clearly abnormal findings on LEA Dopplers.  Has been referred to Vascular Surgery.  Interestingly, she has been referred to Dr. Gwenlyn Found last year-had an appointment in March 2021 canceled for unknown reason.  This could have salt to his use of warmth with him being a vascular cardiologist.      Relevant Orders   EKG  12-Lead (Completed)   CT CORONARY MORPH W/CTA COR W/SCORE W/CA W/CM &/OR WO/CM   CT CORONARY FRACTIONAL FLOW RESERVE DATA PREP   CT CORONARY FRACTIONAL FLOW RESERVE FLUID ANALYSIS   Basic metabolic panel   ECHOCARDIOGRAM COMPLETE   Palpitations    She really did not give me much details on this.  Something we can evaluate the future pending results of her echo and CTA.  As such, we will hold off on monitor for now until we see the results of her symptoms.      Relevant Orders   EKG 12-Lead (Completed)   CT CORONARY MORPH W/CTA COR W/SCORE W/CA W/CM &/OR WO/CM   CT CORONARY FRACTIONAL FLOW RESERVE DATA PREP   CT CORONARY FRACTIONAL FLOW RESERVE FLUID ANALYSIS   Basic metabolic panel   ECHOCARDIOGRAM COMPLETE   Abnormal fourth heart sound (S4)    Interestingly, she does not really have that high blood pressure, but she does have a gallop noted on exam.  I could not tell if this was truly a gallop or extra heart sound such as midsystolic click. There was a soft systolic murmur which is thought to be ejection murmur.  Plan: Evaluate with 2D echocardiogram      Relevant Orders   EKG 12-Lead (Completed)   CT CORONARY MORPH W/CTA COR W/SCORE W/CA W/CM &/OR WO/CM   CT CORONARY FRACTIONAL FLOW RESERVE DATA PREP   CT CORONARY FRACTIONAL FLOW RESERVE FLUID ANALYSIS   Basic  metabolic panel   ECHOCARDIOGRAM COMPLETE   DOE (dyspnea on exertion)    Exertional dyspnea and a smoker could easily be related to lung disease, however we having exertional chest discomfort and PAD along with S4 gallop, need to exclude ileus diastolic dysfunction, systolic dysfunction.  Plan: Check 2D echocardiogram. Ischemic evaluation with Cardiac CTA-CT FFR.      Relevant Orders   EKG 12-Lead (Completed)   CT CORONARY MORPH W/CTA COR W/SCORE W/CA W/CM &/OR WO/CM   CT CORONARY FRACTIONAL FLOW RESERVE DATA PREP   CT CORONARY FRACTIONAL FLOW RESERVE FLUID ANALYSIS   Basic metabolic panel   ECHOCARDIOGRAM  COMPLETE   Hyperlipidemia with target LDL less than 70 (Chronic)    With clear evidence of PAD noted on her LEA Doppler need to consider this to be a CAD risk equivalent.  As such, target LDL should be less than 70.  We are evaluating for ischemic CAD.  Plan: Titrate atorvastatin up to 40 mg daily.      Relevant Medications   atorvastatin (LIPITOR) 40 MG tablet   metoprolol succinate (TOPROL XL) 25 MG 24 hr tablet   Other Relevant Orders   EKG 12-Lead (Completed)   CT CORONARY MORPH W/CTA COR W/SCORE W/CA W/CM &/OR WO/CM   CT CORONARY FRACTIONAL FLOW RESERVE DATA PREP   CT CORONARY FRACTIONAL FLOW RESERVE FLUID ANALYSIS   Basic metabolic panel   ECHOCARDIOGRAM COMPLETE   TOBACCO ABUSE (Chronic)    Longstanding smoker since 1998.  She is down to 1/2 pack a day.  Not very interested in smoking cessation counseling. Was referred by PCP to social work evaluation to discuss quitting options. We will defer further calcium until her coronary CTA results return.  Clearly been smoking cessation with PAD, regardless of CAD.Marland Kitchen        Other Visit Diagnoses    Precordial pain       Relevant Orders   EKG 12-Lead (Completed)   CT CORONARY MORPH W/CTA COR W/SCORE W/CA W/CM &/OR WO/CM   CT CORONARY FRACTIONAL FLOW RESERVE DATA PREP   CT CORONARY FRACTIONAL FLOW RESERVE FLUID ANALYSIS   Basic metabolic panel   ECHOCARDIOGRAM COMPLETE      ===================================  HPI:    [name] is a 65 y.o. female with a PMH notable for: "Angina Pectoris" (no documentation), continued Cigarette Smoking, GERD, Prediabetes, and History of Vulvar Cancer.  Who is being seen today for the evaluation of CHEST PAIN WITH ACTIVITY at the request of Loretha Brasil, FNP./[place], [place], NP  [name] was seen on June 04, 2020 by Cheree Ditto, NP for routine physical examination.  As part of her evaluation she was noted to have intermittent neck pain-for which she takes Tylenol.  Taking  OTC heartburn medication for acid reflux.  She discussing a podiatrist for having "foot "" she tells me that she was referred for vascular evaluation.  Apparently she also noted having some chest pain with activity including going up and down stairs, as well as not smoking cigarettes.  Associated with some heart palpitations.  (Apparently in the past she had a stress test done at former [facility] many years ago).  Appointment from March 2021 to see Dr. Dr. Gwenlyn Found was canceled. => Referred to cardiology and started on statin.  Recent Hospitalizations: None   Seen by podiatry-referred for worsening arterial Dopplers, now referred to Dr. Stanford Breed from vascular surgery.   Reviewed  CV studies:    The following studies were reviewed  today: (if available, images/films reviewed: From Epic Chart or Care Everywhere) . Lower Extremity Arterial Duplex 07/10/2020: Severe obstructive disease of the right femoral-popliteal arteries, as well as right tibial arteries, consider inflow iliac artery disease.  Patent left SFA and popliteal artery with patent tibial arteries.   Interval History:   Kendra ALDERFER presents here today for cardiac evaluation.  She is a relatively difficult historian, and has a hard time really explaining her symptoms.  She says that she has intermittent episodes of feeling of fluttering, bubbling sensation in her chest describes a fullness sensation.  She says that occasionally can get worse with exertion or curl exertion, but also occurs at rest.  She said that she can noted with going up stairs or doing yard work on housework.  She also feels of smoking cigarettes.  With these episodes, she really does not describe rapid heart rate, but does feel her heart beating forcefully.  She says she also does not have increased heart rate stress or anxiety, just feels irregular beats.  She does have some exertional dyspnea, but is notably deconditioned and smokes.  She did certainly get  short of breath with exertion and this may be associated with some of her chest fullness.  Symptoms resolved with rest.  She has had some near syncopal type dizziness, not associated with chest tightness or fullness or palpitations.  CV Review of Symptoms (Summary): positive for - chest pain, dyspnea on exertion, irregular heartbeat, palpitations and Occasional dizziness and lightheadedness with near syncope,; right greater than left leg claudication negative for - edema, loss of consciousness, orthopnea, paroxysmal nocturnal dyspnea, rapid heart rate, shortness of breath or TIA or amaurosis fugax  PAD Screen 08/30/2020  Previous PAD dx? Yes  Previous surgical procedure? No  Pain with walking? Yes  Subsides with rest? Yes  Feet/toe relief with dangling? Yes  Painful, non-healing ulcers? Yes  Extremities discolored? No   The patient does not have symptoms concerning for COVID-19 infection (fever, chills, cough, or new shortness of breath).   REVIEWED OF SYSTEMS   Review of Systems  Constitutional: Positive for malaise/fatigue. Negative for weight loss.  HENT: Negative for congestion and nosebleeds.   Respiratory: Positive for cough (Off-and-on, nonproductive), shortness of breath and wheezing (Rare).   Cardiovascular: Positive for chest pain, palpitations and claudication. Negative for leg swelling.       See HPI  Gastrointestinal: Negative for abdominal pain, blood in stool, constipation and melena.  Genitourinary: Negative for hematuria.  Musculoskeletal: Positive for back pain (With radicular pain down the legs.) and joint pain.  Neurological: Positive for dizziness. Negative for focal weakness, loss of consciousness (See HPI) and weakness.  Psychiatric/Behavioral: Positive for substance abuse (Marijuana occasionally.). Negative for depression and memory loss (Not that she reports, but is very poor historian.). The patient is nervous/anxious. The patient does not have insomnia.       I have reviewed and (if needed) personally updated the patient's problem list, medications, allergies, past medical and surgical history, social and family history.   PAST MEDICAL HISTORY   Past Medical History:  Diagnosis Date  . Cigarette smoker one half pack a day or less    ~ 10 cigarrettes/day  . Complication of anesthesia   . Depression   . GERD (gastroesophageal reflux disease)   . Hyperlipidemia 05/2020   Recently started on atorvastatin  . PONV (postoperative nausea and vomiting)   . Prediabetes   . Vulvar cancer ([place]) 2009   Radical vulvectomy and  left side in July 2009.  Recurrence in 2010-reexcision June 2010.  Last follow-up in 2016.    PAST SURGICAL HISTORY   Past Surgical History:  Procedure Laterality Date  . ABDOMINAL HYSTERECTOMY  03/11/2006   For uterine myoma  . COLONOSCOPY  08/03/2011   Procedure: COLONOSCOPY;  Surgeon: Wonda Horner, MD;  Location: [facility];  Service: Endoscopy;  Laterality: N/A;  . RADICAL VULVECTOMY  12/2007   Laser vulvectomy; repeat procedure in 2010    Immunization History  Administered Date(s) Administered  . Influenza Whole 05/28/2009    MEDICATIONS/ALLERGIES   Current Meds  Medication Sig  . aspirin 81 MG tablet Take 81 mg by mouth every morning.   Marland Kitchen atorvastatin (LIPITOR) 40 MG tablet Take 1 tablet (40 mg total) by mouth daily.  . diclofenac sodium (VOLTAREN) 1 % GEL APPLY 4 GRAMS TO THE AFFECTED AREA(S) BY TOPICAL ROUTE 4 TIMES PER DAY FOR 30 DAYS  . estradiol (CLIMARA) 0.025 mg/24hr patch Place 1 patch (0.025 mg total) onto the skin once a week.  . metoprolol succinate (TOPROL XL) 25 MG 24 hr tablet Take 1 tablet (25 mg total) by mouth daily.  . metoprolol tartrate (LOPRESSOR) 50 MG tablet Take 2 tablets (100 mg total) by mouth once for 1 dose. TAKE TWO HOURS PRIOR TO  SCHEDULE CARDIAC TEST  . [DISCONTINUED] atorvastatin (LIPITOR) 20 MG tablet Take 20 mg by mouth daily.  . [DISCONTINUED] omeprazole (PRILOSEC)  20 MG capsule     Allergies  Allergen Reactions  . Codeine Nausea Only    REACTION: Nausea    SOCIAL HISTORY/FAMILY HISTORY   Reviewed in Epic:  Pertinent findings:  Social History   Tobacco Use  . Smoking status: Current Every Day Smoker    Packs/day: 0.50    Years: 20.00    Pack years: 10.00    Types: Cigarettes  . Smokeless tobacco: Never Used  Vaping Use  . Vaping Use: Never used  Substance Use Topics  . Alcohol use: No  . Drug use: Yes    Types: Marijuana    Comment: Maybe a couple times a month   Social History   Social History Narrative   Currently unemployed.   Smokes 10 cigarettes/day   Occasional smokes marijuana-used to help with pain.   Divorced with 2 children 6   Family History  Problem Relation Age of Onset  . Hypertension Mother   . Stroke Mother 77  . Heart disease Mother        She is not aware of any details.  . Prostate cancer Brother   . Hypertension Sister     OBJCTIVE -PE, EKG, labs   Wt Readings from Last 3 Encounters:  08/27/20 132 lb 12.8 oz (60.2 kg)  01/23/19 128 lb 8 oz (58.3 kg)  08/22/14 139 lb 8 oz (63.3 kg)    Physical Exam: BP 130/60   Pulse 73   Ht 5\' 3"  (1.6 m)   Wt 132 lb 12.8 oz (60.2 kg)   SpO2 96%   BMI 23.52 kg/m  Physical Exam Vitals reviewed.  Constitutional:      General: She is not in acute distress.    Appearance: Normal appearance. She is normal weight. She is not toxic-appearing or diaphoretic.     Comments: Well-groomed.  HENT:     Head: Normocephalic and atraumatic.  Eyes:     Extraocular Movements: Extraocular movements intact.     Pupils: Pupils are equal, round, and reactive to light.  Neck:  Vascular: No carotid bruit, hepatojugular reflux or JVD.  Cardiovascular:     Rate and Rhythm: Normal rate and regular rhythm.  No extrasystoles are present.    Chest Wall: PMI is not displaced.     Pulses: Decreased pulses.          Radial pulses are 1+ on the right side and 1+ on the left  side.       Femoral pulses are on the right side with bruit and on the left side with bruit.      Dorsalis pedis pulses are 1+ on the right side and 2+ on the left side.       Posterior tibial pulses are 0 on the right side and 1+ on the left side.     Heart sounds: S1 normal and S2 normal. Murmur heard.   Harsh crescendo-decrescendo early systolic murmur is present with a grade of 1/6 at the upper right sternal border radiating to the neck. No friction rub. Gallop present. S4 sounds present.   Musculoskeletal:     Cervical back: Normal range of motion and neck supple.  Neurological:     Mental Status: She is alert.     Adult ECG Report  Rate: 73 ;  Rhythm: normal sinus rhythm, sinus arrhythmia and Otherwise normal axis, intervals and durations.;   Narrative Interpretation: Stable EKG.  Recent Labs:   06/26/2020  Na+ 140, K+ 4.0, Cl- 104, HCO3-28, BUN 13, Cr 0.56, Glu 80, Ca2+ 9.7; AST 16, ALT 5, AlkP 67  CBC: W 8.3, H/H 13.8/39.8, Plt 296  TC 223, TG 193, HDL 57, LDL 133; TSH 0.87  Lab Results  Component Value Date   CHOL 214 (H) 02/23/2010   HDL 47 02/23/2010   LDLCALC 143 (H) 02/23/2010   TRIG 118 02/23/2010   CHOLHDL 4.6 Ratio 02/23/2010   Lab Results  Component Value Date   CREATININE 0.70 08/19/2013   BUN 8 08/19/2013   NA 141 08/19/2013   K 3.5 (L) 08/19/2013   CL 102 08/19/2013   CO2 28 02/23/2010   CBC Latest Ref Rng & Units 08/19/2013 08/19/2013 02/23/2010  WBC 4.0 - 10.5 K/uL - 5.8 8.6  Hemoglobin 12.0 - 15.0 g/dL 14.6 13.5 13.4  Hematocrit 36.0 - 46.0 % 43.0 39.1 42.2  Platelets 150 - 400 K/uL - 228 342    Lab Results  Component Value Date   TSH 0.754 11/15/2007    ==================================================  COVID-19 Education: The signs and symptoms of COVID-19 were discussed with the patient and how to seek care for testing (follow up with PCP or arrange E-visit).   The importance of social distancing and COVID-19 vaccination was discussed  today. The patient is practicing social distancing & Masking.   I spent a total of 46minutes with the patient spent in direct patient consultation.  Additional time spent with chart review  / charting (studies, outside notes, etc): 57min Total Time: 62 min   Current medicines are reviewed at length with the patient today.  (+/- concerns) N/A  This visit occurred during the SARS-CoV-2 public health emergency.  Safety protocols were in place, including screening questions prior to the visit, additional usage of staff PPE, and extensive cleaning of exam room while observing appropriate contact time as indicated for disinfecting solutions.  Notice: This dictation was prepared with Dragon dictation along with smaller phrase technology. Any transcriptional errors that result from this process are unintentional and may not be corrected upon review.  Patient Instructions / Medication  Changes & Studies & Tests Ordered   Patient Instructions  Medication Instructions:   Toprol  ( metoprolol succinate) 25 mg  One tablet  At bedtime   increase Atorvastatin 40 mg   One tablet daily  *If you need a refill on your cardiac medications before your next appointment, please call your pharmacy*   Lab Work: Not needed See instruction for test below  Testing/Procedures:  will be schedule at [place] has requested that you have an echocardiogram. Echocardiography is a painless test that uses sound waves to create images of your heart. It provides your doctor with information about the size and shape of your heart and how well your heart's chambers and valves are working. This procedure takes approximately one hour. There are no restrictions for this procedure.   and   Once authorization is obtained by insurance - test will be schedule at [facility] Radiology dept at [facility] [address]  Your physician has requested that you have cardiac CTA. Cardiac computed  tomography (CT) is a painless test that uses an x-ray machine to take clear, detailed pictures of your heart. Please follow instruction sheet as given.     Follow-Up: At [facility], you and your health needs are our priority.  As part of our continuing mission to provide you with exceptional heart care, we have created designated Provider Care Teams.  These Care Teams include your primary Cardiologist (physician) and Advanced Practice Providers (APPs -  Physician Assistants and Nurse Practitioners) who all work together to provide you with the care you need, when you need it.  We recommend signing up for the patient portal called "MyChart".  Sign up information is provided on this After Visit Summary.  MyChart is used to connect with patients for Virtual Visits (Telemedicine).  Patients are able to view lab/test results, encounter notes, upcoming appointments, etc.  Non-urgent messages can be sent to your provider as well.   To learn more about what you can do with MyChart, go to [email or web address].    Your next appointment:   2 month(s)  The format for your next appointment:   In Person  Provider:   Glenetta Hew, MD   Other Instructions   Your cardiac CT will be scheduled at the below location:   [facility] [address] [place], [place] [address] [phone]    Please arrive at the [facility] main entrance (entrance A) of [facility] 30 minutes prior to test start time. Proceed to the [facility] Radiology Department (first floor) to check-in and test prep.    Please follow these instructions carefully (unless otherwise directed):  Please have labs ( BMP) done in one week prior to test   On the Night Before the Test: . Be sure to Drink plenty of water. . Do not consume any caffeinated/decaffeinated beverages or chocolate 12 hours prior to your test. . Do not take any antihistamines 12 hours prior to your test.   On the Day of the  Test: . Drink plenty of water until 1 hour prior to the test. . Do not eat any food 4 hours prior to the test. . You may take your regular medications prior to the test.  . Take metoprolol (Lopressor) two hours prior to test. . FEMALES- please wear underwire-free bra if available   *       After the Test: . Drink plenty of water. . After receiving IV contrast, you  may experience a mild flushed feeling. This is normal. . On occasion, you may experience a mild rash up to 24 hours after the test. This is not dangerous. If this occurs, you can take Benadryl 25 mg and increase your fluid intake. . If you experience trouble breathing, this can be serious. If it is severe call 911 IMMEDIATELY. If it is mild, please call our office.    Once we have confirmed authorization from your insurance company, we will call you to set up a date and time for your test. Based on how quickly your insurance processes prior authorizations requests, please allow up to 4 weeks to be contacted for scheduling your Cardiac CT appointment. Be advised that routine Cardiac CT appointments could be scheduled as many as 8 weeks after your provider has ordered it.  For non-scheduling related questions, please contact the cardiac imaging nurse navigator should you have any questions/concerns: Marchia Bond, Cardiac Imaging Nurse Navigator Gordy Clement, Cardiac Imaging Nurse Navigator [facility] Direct Office Dial: [phone]   For scheduling needs, including cancellations and rescheduling, please call [place], [phone].     Studies Ordered:   Orders Placed This Encounter  Procedures  . CT CORONARY MORPH W/CTA COR W/SCORE W/CA W/CM &/OR WO/CM  . CT CORONARY FRACTIONAL FLOW RESERVE DATA PREP  . CT CORONARY FRACTIONAL FLOW RESERVE FLUID ANALYSIS  . Basic metabolic panel  . EKG 12-Lead  . ECHOCARDIOGRAM COMPLETE     Glenetta Hew, M.D., M.S. Interventional Cardiologist    Pager # [phone] Phone # [phone] [address] [place], [place] [address]   Thank you for choosing Heartcare at [facility]!!

## 2020-08-27 NOTE — Patient Instructions (Addendum)
Medication Instructions:   Toprol  ( metoprolol succinate) 25 mg  One tablet  At bedtime   increase Atorvastatin 40 mg   One tablet daily  *If you need a refill on your cardiac medications before your next appointment, please call your pharmacy*   Lab Work: Not needed See instruction for test below  Testing/Procedures:  will be schedule at [place] has requested that you have an echocardiogram. Echocardiography is a painless test that uses sound waves to create images of your heart. It provides your doctor with information about the size and shape of your heart and how well your heart's chambers and valves are working. This procedure takes approximately one hour. There are no restrictions for this procedure.   and   Once authorization is obtained by insurance - test will be schedule at [facility] Radiology dept at [facility] [address]  Your physician has requested that you have cardiac CTA. Cardiac computed tomography (CT) is a painless test that uses an x-ray machine to take clear, detailed pictures of your heart. Please follow instruction sheet as given.     Follow-Up: At [facility], you and your health needs are our priority.  As part of our continuing mission to provide you with exceptional heart care, we have created designated Provider Care Teams.  These Care Teams include your primary Cardiologist (physician) and Advanced Practice Providers (APPs -  Physician Assistants and Nurse Practitioners) who all work together to provide you with the care you need, when you need it.  We recommend signing up for the patient portal called "MyChart".  Sign up information is provided on this After Visit Summary.  MyChart is used to connect with patients for Virtual Visits (Telemedicine).  Patients are able to view lab/test results, encounter notes, upcoming appointments, etc.  Non-urgent messages can be sent to your provider as well.   To learn more about  what you can do with MyChart, go to [email or web address].    Your next appointment:   2 month(s)  The format for your next appointment:   In Person  Provider:   Glenetta Hew, MD   Other Instructions   Your cardiac CT will be scheduled at the below location:   [facility] [address] [place], [place] [address] [phone]    Please arrive at the [facility] main entrance (entrance A) of [facility] 30 minutes prior to test start time. Proceed to the [facility] Radiology Department (first floor) to check-in and test prep.    Please follow these instructions carefully (unless otherwise directed):  Please have labs ( BMP) done in one week prior to test   On the Night Before the Test: . Be sure to Drink plenty of water. . Do not consume any caffeinated/decaffeinated beverages or chocolate 12 hours prior to your test. . Do not take any antihistamines 12 hours prior to your test.   On the Day of the Test: . Drink plenty of water until 1 hour prior to the test. . Do not eat any food 4 hours prior to the test. . You may take your regular medications prior to the test.  . Take metoprolol (Lopressor) two hours prior to test. . FEMALES- please wear underwire-free bra if available   *       After the Test: . Drink plenty of water. . After receiving IV contrast, you may experience a mild flushed feeling. This is normal. . On occasion,  you may experience a mild rash up to 24 hours after the test. This is not dangerous. If this occurs, you can take Benadryl 25 mg and increase your fluid intake. . If you experience trouble breathing, this can be serious. If it is severe call 911 IMMEDIATELY. If it is mild, please call our office.    Once we have confirmed authorization from your insurance company, we will call you to set up a date and time for your test. Based on how quickly your insurance processes prior authorizations requests, please allow up  to 4 weeks to be contacted for scheduling your Cardiac CT appointment. Be advised that routine Cardiac CT appointments could be scheduled as many as 8 weeks after your provider has ordered it.  For non-scheduling related questions, please contact the cardiac imaging nurse navigator should you have any questions/concerns: Marchia Bond, Cardiac Imaging Nurse Navigator Gordy Clement, Cardiac Imaging Nurse Navigator [facility] Direct Office Dial: [phone]   For scheduling needs, including cancellations and rescheduling, please call [place], [phone].

## 2020-08-28 ENCOUNTER — Other Ambulatory Visit: Payer: Self-pay | Admitting: Cardiology

## 2020-08-30 ENCOUNTER — Encounter: Payer: Self-pay | Admitting: Cardiology

## 2020-08-30 NOTE — Assessment & Plan Note (Signed)
Clearly abnormal findings on LEA Dopplers.  Has been referred to Vascular Surgery.  Interestingly, she has been referred to Dr. Gwenlyn Found last year-had an appointment in March 2021 canceled for unknown reason.  This could have salt to his use of warmth with him being a vascular cardiologist.

## 2020-08-30 NOTE — Assessment & Plan Note (Signed)
Exertional dyspnea and a smoker could easily be related to lung disease, however we having exertional chest discomfort and PAD along with S4 gallop, need to exclude ileus diastolic dysfunction, systolic dysfunction.  Plan: Check 2D echocardiogram. Ischemic evaluation with Cardiac CTA-CT FFR.

## 2020-08-30 NOTE — Assessment & Plan Note (Addendum)
She has this fullness sensation in her chest that happens at rest with a bubbling sensation that sounds like it very well could be GERD, but she does also has a tightness sensation with exertion.  Could be 2 different symptoms.  She clearly has CAD risk equivalent with PAD noted on Dopplers.  She is a smoker with hyperlipidemia and prediabetes and therefore carries significant risk for CAD.  Plan:   We will start low-dose metoprolol 25 mg twice daily and increase atorvastatin dose to 40 mg daily.   Also recommend continuing aspirin 81 mg.  Cardiac CT Angiogram to assess baseline CAD as well as for any ischemia with CT FFR if indicated. Prefer Coronary/Cardiac CTA over my view because it allows [place] to assess for underlying CAD not seen by Myoview.

## 2020-08-30 NOTE — Assessment & Plan Note (Signed)
With clear evidence of PAD noted on her LEA Doppler need to consider this to be a CAD risk equivalent.  As such, target LDL should be less than 70.  We are evaluating for ischemic CAD.  Plan: Titrate atorvastatin up to 40 mg daily.

## 2020-08-30 NOTE — Assessment & Plan Note (Signed)
Longstanding smoker since 1998.  She is down to 1/2 pack a day.  Not very interested in smoking cessation counseling. Was referred by PCP to social work evaluation to discuss quitting options. We will defer further calcium until her coronary CTA results return.  Clearly been smoking cessation with PAD, regardless of CAD.[name]

## 2020-08-30 NOTE — Assessment & Plan Note (Signed)
Interestingly, she does not really have that high blood pressure, but she does have a gallop noted on exam.  I could not tell if this was truly a gallop or extra heart sound such as midsystolic click. There was a soft systolic murmur which is thought to be ejection murmur.  Plan: Evaluate with 2D echocardiogram

## 2020-08-30 NOTE — Assessment & Plan Note (Signed)
She really did not give me much details on this.  Something we can evaluate the future pending results of her echo and CTA.  As such, we will hold off on monitor for now until we see the results of her symptoms.

## 2020-09-09 ENCOUNTER — Encounter: Payer: Medicare Other | Admitting: Vascular Surgery

## 2020-09-11 ENCOUNTER — Telehealth (HOSPITAL_COMMUNITY): Payer: Self-pay | Admitting: *Deleted

## 2020-09-11 LAB — BASIC METABOLIC PANEL
BUN/Creatinine Ratio: 9 — ABNORMAL LOW (ref 12–28)
BUN: 7 mg/dL — ABNORMAL LOW (ref 8–27)
CO2: 23 mmol/L (ref 20–29)
Calcium: 9.7 mg/dL (ref 8.7–10.3)
Chloride: 103 mmol/L (ref 96–106)
Creatinine, Ser: 0.81 mg/dL (ref 0.57–1.00)
Glucose: 83 mg/dL (ref 65–99)
Potassium: 4.4 mmol/L (ref 3.5–5.2)
Sodium: 142 mmol/L (ref 134–144)
eGFR: 80 mL/min/{1.73_m2} (ref >59)

## 2020-09-11 NOTE — Telephone Encounter (Signed)
Reaching out to patient to offer assistance regarding upcoming cardiac imaging study; pt verbalizes understanding of appt date/time, parking situation and where to check in, pre-test NPO status and medications ordered, and verified current allergies; name and call back number provided for further questions should they arise  Gloria Lambertson RN Navigator Cardiac Imaging [facility] [phone] office [phone] cell  

## 2020-09-12 ENCOUNTER — Other Ambulatory Visit: Payer: Self-pay

## 2020-09-12 ENCOUNTER — Ambulatory Visit (HOSPITAL_COMMUNITY)
Admission: RE | Admit: 2020-09-12 | Discharge: 2020-09-12 | Disposition: A | Discharge status: Home / Self Care | Payer: Medicare Other | Source: Ambulatory Visit | Attending: Cardiology | Admitting: Cardiology

## 2020-09-12 ENCOUNTER — Encounter (HOSPITAL_COMMUNITY): Payer: Self-pay

## 2020-09-12 DIAGNOSIS — R079 Chest pain, unspecified: Secondary | ICD-10-CM | POA: Diagnosis present

## 2020-09-12 DIAGNOSIS — R0600 Dyspnea, unspecified: Secondary | ICD-10-CM | POA: Insufficient documentation

## 2020-09-12 DIAGNOSIS — I739 Peripheral vascular disease, unspecified: Secondary | ICD-10-CM | POA: Diagnosis present

## 2020-09-12 DIAGNOSIS — R072 Precordial pain: Secondary | ICD-10-CM | POA: Diagnosis present

## 2020-09-12 DIAGNOSIS — E785 Hyperlipidemia, unspecified: Secondary | ICD-10-CM | POA: Diagnosis present

## 2020-09-12 DIAGNOSIS — R0609 Other forms of dyspnea: Secondary | ICD-10-CM

## 2020-09-12 DIAGNOSIS — R012 Other cardiac sounds: Secondary | ICD-10-CM | POA: Insufficient documentation

## 2020-09-12 DIAGNOSIS — R002 Palpitations: Secondary | ICD-10-CM | POA: Diagnosis present

## 2020-09-12 MED ORDER — NITROGLYCERIN 0.4 MG SL SUBL: 0.8000 mg | SUBLINGUAL_TABLET | Freq: Once | SUBLINGUAL | Status: DC

## 2020-09-12 NOTE — Progress Notes (Signed)
VAST consulted to obtain 18G IV for CT coronary. Assessed bilateral arms with ultrasound. One vessel in each arm large enough and appropriate for study, however, both were too deep for USGIV. Midline placement needed. Pt requested study to be rescheduled.

## 2020-09-15 ENCOUNTER — Other Ambulatory Visit (HOSPITAL_COMMUNITY): Payer: Self-pay | Admitting: *Deleted

## 2020-09-15 MED ORDER — METOPROLOL TARTRATE 50 MG PO TABS: 100.0000 mg | ORAL_TABLET | Freq: Once | ORAL | 0 refills | Status: DC

## 2020-09-16 ENCOUNTER — Other Ambulatory Visit (HOSPITAL_COMMUNITY): Payer: Self-pay | Admitting: Cardiology

## 2020-09-19 ENCOUNTER — Encounter (HOSPITAL_COMMUNITY): Payer: Self-pay | Admitting: Cardiology

## 2020-09-19 ENCOUNTER — Other Ambulatory Visit (HOSPITAL_COMMUNITY): Payer: Medicare Other

## 2020-09-19 NOTE — Progress Notes (Unsigned)
Patient ID: [name], female   DOB: [birth year], 65 y.o.   MRN: [id number]  Verified appointment "no show" status with Meeka at 2:59pm.

## 2020-09-22 ENCOUNTER — Telehealth (HOSPITAL_COMMUNITY): Payer: Self-pay | Admitting: Emergency Medicine

## 2020-09-22 NOTE — Telephone Encounter (Signed)
Attempted to call patient regarding upcoming cardiac CT appointment. °Left message on voicemail with name and callback number °Kadin Canipe RN Navigator Cardiac Imaging °[facility] °[phone] Office °[phone] Cell ° °

## 2020-09-23 ENCOUNTER — Encounter: Payer: Medicare Other | Admitting: Vascular Surgery

## 2020-09-24 ENCOUNTER — Other Ambulatory Visit: Payer: Self-pay | Admitting: *Deleted

## 2020-09-24 ENCOUNTER — Other Ambulatory Visit: Payer: Self-pay

## 2020-09-24 ENCOUNTER — Ambulatory Visit (HOSPITAL_COMMUNITY): Admission: RE | Admit: 2020-09-24 | Payer: Medicare Other | Source: Ambulatory Visit

## 2020-09-24 DIAGNOSIS — I739 Peripheral vascular disease, unspecified: Secondary | ICD-10-CM

## 2020-09-24 DIAGNOSIS — Z006 Encounter for examination for normal comparison and control in clinical research program: Secondary | ICD-10-CM

## 2020-09-24 NOTE — Research (Signed)
IDENTIFY Informed Consent                  Subject Name: [name]    Subject met inclusion and exclusion criteria.  The informed consent form, study requirements and expectations were reviewed with the subject and questions and concerns were addressed prior to the signing of the consent form.  The subject verbalized understanding of the trial requirements.  The subject agreed to participate in the IDENTIFY trial and signed the informed consent at 13:12PM on 09/24/20.  The informed consent was obtained prior to performance of any protocol-specific procedures for the subject.  A copy of the signed informed consent was given to the subject and a copy was placed in the subject's medical record.   Meade Maw , Naval architect

## 2020-10-03 ENCOUNTER — Telehealth (HOSPITAL_COMMUNITY): Payer: Self-pay | Admitting: Cardiology

## 2020-10-03 NOTE — Telephone Encounter (Signed)
Will send this message to Dr. Ellyn Hack and Primary Covering RN as a general FYI.

## 2020-10-03 NOTE — Telephone Encounter (Signed)
Just an FYI. We have made several attempts to contact this patient including sending a letter to schedule or reschedule their echocardiogram. We will be removing the patient from the echo WQ.   09/19/20 NO SHOWED-MAILED LETTER [facility]     Thank you

## 2020-10-06 ENCOUNTER — Ambulatory Visit (HOSPITAL_COMMUNITY)
Admission: RE | Admit: 2020-10-06 | Discharge: 2020-10-06 | Disposition: A | Discharge status: Home / Self Care | Payer: Medicare Other | Source: Ambulatory Visit | Attending: Vascular Surgery | Admitting: Vascular Surgery

## 2020-10-06 ENCOUNTER — Other Ambulatory Visit: Payer: Self-pay

## 2020-10-06 ENCOUNTER — Ambulatory Visit (INDEPENDENT_AMBULATORY_CARE_PROVIDER_SITE_OTHER)
Admission: RE | Admit: 2020-10-06 | Discharge: 2020-10-06 | Disposition: A | Discharge status: Home / Self Care | Payer: Medicare Other | Source: Ambulatory Visit | Attending: Vascular Surgery | Admitting: Vascular Surgery

## 2020-10-06 ENCOUNTER — Ambulatory Visit (INDEPENDENT_AMBULATORY_CARE_PROVIDER_SITE_OTHER): Payer: Medicare Other | Admitting: Vascular Surgery

## 2020-10-06 ENCOUNTER — Encounter: Payer: Self-pay | Admitting: Vascular Surgery

## 2020-10-06 VITALS — BP 175/73 | HR 70 | Temp 98.1°F | Resp 16 | Ht 63.0 in | Wt 132.8 lb

## 2020-10-06 DIAGNOSIS — I739 Peripheral vascular disease, unspecified: Secondary | ICD-10-CM

## 2020-10-06 NOTE — Progress Notes (Signed)
Patient ID: [name], female   DOB: [birth year], 65 y.o.   MRN: [id number]  Reason for Consult: New Patient (Initial Visit)   Referred by Cipriano Mile, NP  Subjective:     HPI:  [name] is a 65 y.o. female long history of smoking also has diagnosis of prediabetes and hyperlipidemia.  She states that she does take baby aspirin daily she has been prescribed a statin but she does not take it frequently.  Currently smoking 1/2 pack to 1 pack/day.  She states that she has pain with short distance ambulation approximately 50 yards.  She also occasionally has pain in her leg right greater than left at nighttime.  She helps this by elevating her leg sometimes putting pressure on the foot helps.  She has noted tissue loss or ulceration.  She denies any history of stroke, TIA or amaurosis.  No significant coronary artery disease in her past.  She denies any personal or family history of aneurysm disease.  Past Medical History:  Diagnosis Date  . Cigarette smoker one half pack a day or less    ~ 10 cigarrettes/day  . Complication of anesthesia   . Depression   . GERD (gastroesophageal reflux disease)   . Hyperlipidemia 05/2020   Recently started on atorvastatin  . PONV (postoperative nausea and vomiting)   . Prediabetes   . Vulvar cancer ([place]) 2009   Radical vulvectomy and left side in July 2009.  Recurrence in 2010-reexcision June 2010.  Last follow-up in 2016.   Family History  Problem Relation Age of Onset  . Hypertension Mother   . Stroke Mother 57  . Heart disease Mother        She is not aware of any details.  . Prostate cancer Brother   . Hypertension Sister    Past Surgical History:  Procedure Laterality Date  . ABDOMINAL HYSTERECTOMY  03/11/2006   For uterine myoma  . COLONOSCOPY  08/03/2011   Procedure: COLONOSCOPY;  Surgeon: Wonda Horner, MD;  Location: [facility] ENDOSCOPY;  Service: Endoscopy;  Laterality: N/A;  . RADICAL VULVECTOMY  12/2007   Laser vulvectomy;  repeat procedure in 2010    Short Social History:  Social History   Tobacco Use  . Smoking status: Current Every Day Smoker    Packs/day: 0.50    Years: 20.00    Pack years: 10.00    Types: Cigarettes  . Smokeless tobacco: Never Used  Substance Use Topics  . Alcohol use: No    Allergies  Allergen Reactions  . Codeine Nausea Only    REACTION: Nausea    Current Outpatient Medications  Medication Sig Dispense Refill  . aspirin 81 MG tablet Take 81 mg by mouth every morning.     Marland Kitchen atorvastatin (LIPITOR) 20 MG tablet Take 1 tablet by mouth daily.    . diclofenac sodium (VOLTAREN) 1 % GEL APPLY 4 GRAMS TO THE AFFECTED AREA(S) BY TOPICAL ROUTE 4 TIMES PER DAY FOR 30 DAYS    . estradiol (CLIMARA) 0.025 mg/24hr patch Place 1 patch (0.025 mg total) onto the skin once a week. 8 patch 3  . metoprolol succinate (TOPROL XL) 25 MG 24 hr tablet Take 1 tablet (25 mg total) by mouth daily. 90 tablet 3   No current facility-administered medications for this visit.    Review of Systems  Constitutional:  Constitutional negative. HENT: HENT negative.  Eyes: Eyes negative.  Cardiovascular: Positive for claudication.  GI: Gastrointestinal negative.  Musculoskeletal: Positive for  leg pain.  Skin: Skin negative.  Neurological: Neurological negative. Hematologic: Hematologic/lymphatic negative.  Psychiatric: Psychiatric negative.        Objective:  Objective   Vitals:   10/06/20 0927  BP: (!) 175/73  Pulse: 70  Resp: 16  Temp: 98.1 F (36.7 C)  SpO2: 96%  Weight: 132 lb 12.8 oz (60.2 kg)  Height: 5\' 3"  (1.6 m)   Body mass index is 23.52 kg/m.  Physical Exam HENT:     Head: Normocephalic.     Nose:     Comments: Wearing a mask Eyes:     Pupils: Pupils are equal, round, and reactive to light.  Cardiovascular:     Rate and Rhythm: Normal rate.     Pulses:          Radial pulses are 2+ on the right side and 2+ on the left side.       Femoral pulses are 2+ on the right  side and 2+ on the left side.      Popliteal pulses are 0 on the right side and 2+ on the left side.       Dorsalis pedis pulses are 0 on the right side and 2+ on the left side.  Pulmonary:     Effort: Pulmonary effort is normal.  Abdominal:     General: Abdomen is flat.     Palpations: Abdomen is soft. There is no mass.  Musculoskeletal:        General: No swelling. Normal range of motion.  Skin:    General: Skin is warm and dry.  Neurological:     General: No focal deficit present.     Mental Status: She is alert.  Psychiatric:        Mood and Affect: Mood normal.        Behavior: Behavior normal.        Thought Content: Thought content normal.        Judgment: Judgment normal.     Data: ABI Findings:  +---------+------------------+-----+----------+--------+  Right  Rt Pressure (mmHg)IndexWaveform Comment   +---------+------------------+-----+----------+--------+  Brachial 161                      +---------+------------------+-----+----------+--------+  ATA   92        0.57            +---------+------------------+-----+----------+--------+  PTA   96        0.59 monophasic      +---------+------------------+-----+----------+--------+  DP                monophasic      +---------+------------------+-----+----------+--------+  Great Toe41        0.25            +---------+------------------+-----+----------+--------+   +---------+------------------+-----+---------+-------+  Left   Lt Pressure (mmHg)IndexWaveform Comment  +---------+------------------+-----+---------+-------+  Brachial 162                      +---------+------------------+-----+---------+-------+  ATA   151        0.93            +---------+------------------+-----+---------+-------+  PTA   154        0.95  triphasic      +---------+------------------+-----+---------+-------+  DP                triphasic      +---------+------------------+-----+---------+-------+  Great Toe127        0.78            +---------+------------------+-----+---------+-------+   +-------+-----------+-----------+------------+------------+  ABI/TBIToday's ABIToday's TBIPrevious ABIPrevious TBI  +-------+-----------+-----------+------------+------------+  Right 0.59    0.25                  +-------+-----------+-----------+------------+------------+  Left  0.95    0.78                  +-------+-----------+-----------+------------+------------+   +----------+--------+-----+---------------+----------+--------+  RIGHT   PSV cm/sRatioStenosis    Waveform Comments  +----------+--------+-----+---------------+----------+--------+  CFA Distal107              triphasic       +----------+--------+-----+---------------+----------+--------+  DFA    164              triphasic       +----------+--------+-----+---------------+----------+--------+  SFA Prox 29      30-49% stenosismonophasic      +----------+--------+-----+---------------+----------+--------+  SFA Mid  398      75-99% stenosismonophasic      +----------+--------+-----+---------------+----------+--------+  SFA Distal68              monophasic      +----------+--------+-----+---------------+----------+--------+  POP Prox 49              monophasic      +----------+--------+-----+---------------+----------+--------+  POP Distal41              monophasic      +----------+--------+-----+---------------+----------+--------+  ATA Distal23              monophasic       +----------+--------+-----+---------------+----------+--------+  PTA Distal18              monophasic      +----------+--------+-----+---------------+----------+--------+   A focal velocity elevation of 398 cm/s was obtained at [facility] SFA with a VR  of 9.5. Findings are characteristic of 75-99% stenosis.      +----------+--------+-----+--------+---------+--------+  LEFT   PSV cm/sRatioStenosisWaveform Comments  +----------+--------+-----+--------+---------+--------+  CFA Distal99          triphasic      +----------+--------+-----+--------+---------+--------+  DFA    73          biphasic       +----------+--------+-----+--------+---------+--------+  SFA Prox 105          biphasic       +----------+--------+-----+--------+---------+--------+  SFA Mid  155          biphasic       +----------+--------+-----+--------+---------+--------+  SFA Distal151          biphasic       +----------+--------+-----+--------+---------+--------+  POP Prox 126          biphasic       +----------+--------+-----+--------+---------+--------+  POP Distal91          biphasic       +----------+--------+-----+--------+---------+--------+  ATA Distal63          biphasic       +----------+--------+-----+--------+---------+--------+  PTA Distal44          biphasic       +----------+--------+-----+--------+---------+--------+         Assessment/Plan:     65 year old female with short distance claudication that does not appear to be life limiting at this time.  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  She has no new tissue loss or ulceration does not appear to have rest pain her ABIs are in  the moderate range.  I have recommended smoking cessation continuing aspirin and beginning to take her statin as  prescribed.  I will get her back in 3 to 4 months at which time hopefully she can have less than 1/2 pack cigarettes per day if she is still having significant symptoms we can consider angiography from a left common femoral approach which would likely require stenting of her right SFA.  I discussed that if she continues to smoke she would likely have progressive disease although at this time her risk of amputation is very low approximately 5% of 5 years and that any stenting while she continues to smoke would likely be get more procedures in the future.  She demonstrates good understanding.  We will see her in 3 to 4 months should she not have issues prior.     Waynetta Sandy MD [facility]

## 2020-10-08 ENCOUNTER — Other Ambulatory Visit: Payer: Self-pay

## 2020-10-08 DIAGNOSIS — I739 Peripheral vascular disease, unspecified: Secondary | ICD-10-CM

## 2020-11-13 ENCOUNTER — Ambulatory Visit: Payer: Medicare Other | Admitting: Cardiology

## 2020-11-15 NOTE — Progress Notes (Deleted)
Cardiology Office Note:    Date:  11/15/2020   ID:  Shy, Guallpa 03-31-1956, MRN [id number]  PCP:  Cipriano Mile, NP  Cardiologist:  Glenetta Hew, MD  Electrophysiologist:  None   Referring MD: Eaton Honour, MD   Chief Complaint: follow-up of chest pain and shortness of breath  History of Present Illness:    [name] is a 65 y.o. female with a history of PAD with right SFA disease noted on ultrasounds in 09/2020 followed by Vascular Surgery,  hyperlipidemia, prediabetes, GERD, vulvar cancer s/p radical vulvectomy in 12/2007, and tobacco abuse who is followed by Dr. Ellyn Hack and presents today for follow-up of dyspnea on exertion and chest pain.  Patient was referred to Dr. Ellyn Hack in 08/2020 for further evaluation of chest pain. Patient was noted to be a difficult history with trouble really explaining her symptoms but reported intermittent episodes of feeling "fluttering and bubbling" sensation as well as fullness in her chest sometimes worse with activity. She also reported some exertional dyspnea but was noted to be deconditioned and is an active smoker. Patient was started on Toprol-XL 25mg  daily and Lipitor was increased. Echo and coronary CTA were ordered for further evaluation.   Patient presents today for follow-up. Unfortunately, neither Echo nor coronary CTA have been done yet. ***  Chest Pain Dyspnea on Exertion - *** - Echo and coronary CTA were ordered at last visit but have not been done yet. Will reschedule Echo today. Given national contrast shortage, will cancel coronary CTA and order Lexiscan Myoview. ***  Palpitations - *** - Continue Toprol-XL 25mg  daily.  PAD - Lower extremity arterial ultrasounds/ABI in 09/2020 showed 75-99% stenosis of the right SFA. - Continue aspirin and statin. - Followed by Dr. Donzetta Matters. Plan is for repeat ultrasounds in 12/2020.  Hyperlipidemia - *** - Dr. Ellyn Hack increased Lipitor to 40mg  daily at last visit. *** - Followed by  PCP.  Prediabetes - Hemoglobin A1c *** - Followed by PCP.  Tobacco Abuse - *** - Complete cessation recommended.    Past Medical History:  Diagnosis Date  . Cigarette smoker one half pack a day or less    ~ 10 cigarrettes/day  . Complication of anesthesia   . Depression   . GERD (gastroesophageal reflux disease)   . Hyperlipidemia 05/2020   Recently started on atorvastatin  . PONV (postoperative nausea and vomiting)   . Prediabetes   . Vulvar cancer ([place]) 2009   Radical vulvectomy and left side in July 2009.  Recurrence in 2010-reexcision June 2010.  Last follow-up in 2016.    Past Surgical History:  Procedure Laterality Date  . ABDOMINAL HYSTERECTOMY  03/11/2006   For uterine myoma  . COLONOSCOPY  08/03/2011   Procedure: COLONOSCOPY;  Surgeon: Wonda Horner, MD;  Location: [facility];  Service: Endoscopy;  Laterality: N/A;  . RADICAL VULVECTOMY  12/2007   Laser vulvectomy; repeat procedure in 2010    Current Medications: No outpatient medications have been marked as taking for the 11/18/20 encounter (Appointment) with Darreld Mclean, PA-C.     Allergies:   Codeine   Social History   Socioeconomic History  . Marital status: Divorced    Spouse name: Not on file  . Number of children: 2  . Years of education: Not on file  . Highest education level: Not on file  Occupational History    Employer: UNEMPLOYED  Tobacco Use  . Smoking status: Current Every Day Smoker    Packs/day: 0.50  Years: 20.00    Pack years: 10.00    Types: Cigarettes  . Smokeless tobacco: Never Used  Vaping Use  . Vaping Use: Never used  Substance and Sexual Activity  . Alcohol use: No  . Drug use: Yes    Types: Marijuana    Comment: Maybe a couple times a month  . Sexual activity: Not Currently  Other Topics Concern  . Not on file  Social History Narrative   Currently unemployed.   Smokes 10 cigarettes/day   Occasional smokes marijuana-used to help with pain.   Divorced  with 2 children 6   Social Determinants of Health   Financial Resource Strain: Not on file  Food Insecurity: Not on file  Transportation Needs: Not on file  Physical Activity: Not on file  Stress: Not on file  Social Connections: Not on file     Family History: The patient's ***family history includes Heart disease in her mother; Hypertension in her mother and sister; Prostate cancer in her brother; Stroke (age of onset: 59) in her mother.  ROS:   Please see the history of present illness.    *** All other systems reviewed and are negative.  EKGs/Labs/Other Studies Reviewed:    The following studies were reviewed today:  Lower Extremity Doppler/ABI 10/06/2020: Summary:  - Right: Resting right ankle-brachial index indicates moderate right lower  extremity arterial disease. The right toe-brachial index is abnormal. RT  great toe pressure = 41 mmHg.  - Left: Resting left ankle-brachial index is within normal range. No  evidence of significant left lower extremity arterial disease. The left  toe-brachial index is normal. LT Great toe pressure = 127 mmHg.   Summary: - Right: 75-99% stenosis noted in the superficial femoral artery.  - Left: Near normal examination.  EKG:  EKG not ordered today.   Recent Labs: 09/10/2020: BUN 7; Creatinine, Ser 0.81; Potassium 4.4; Sodium 142  Recent Lipid Panel    Component Value Date/Time   CHOL 214 (H) 02/23/2010 2151   TRIG 118 02/23/2010 2151   HDL 47 02/23/2010 2151   CHOLHDL 4.6 Ratio 02/23/2010 2151   VLDL 24 02/23/2010 2151   LDLCALC 143 (H) 02/23/2010 2151    Physical Exam:    Vital Signs: There were no vitals taken for this visit.    Wt Readings from Last 3 Encounters:  10/06/20 132 lb 12.8 oz (60.2 kg)  08/27/20 132 lb 12.8 oz (60.2 kg)  01/23/19 128 lb 8 oz (58.3 kg)     General: 65 y.o. female in no acute distress. HEENT: Normocephalic and atraumatic. Sclera clear. EOMs intact. Neck: Supple. No carotid bruits. No  JVD. Heart: *** RRR. Distinct S1 and S2. No murmurs, gallops, or rubs. Radial and distal pedal pulses 2+ and equal bilaterally. Lungs: No increased work of breathing. Clear to ausculation bilaterally. No wheezes, rhonchi, or rales.  Abdomen: Soft, non-distended, and non-tender to palpation. Bowel sounds present in all 4 quadrants.  MSK: Normal strength and tone for age. *** Extremities: No lower extremity edema.    Skin: Warm and dry. Neuro: Alert and oriented x3. No focal deficits. Psych: Normal affect. Responds appropriately.   Assessment:    No diagnosis found.  Plan:     Disposition: Follow up in ***   Medication Adjustments/Labs and Tests Ordered: Current medicines are reviewed at length with the patient today.  Concerns regarding medicines are outlined above.  No orders of the defined types were placed in this encounter.  No orders of the  defined types were placed in this encounter.   There are no Patient Instructions on file for this visit.   Signed, Darreld Mclean, PA-C  11/15/2020 8:29 AM    [facility]

## 2020-11-18 ENCOUNTER — Ambulatory Visit: Payer: Medicare Other | Admitting: Student

## 2021-01-09 ENCOUNTER — Encounter (HOSPITAL_COMMUNITY): Payer: Medicare Other

## 2021-01-09 ENCOUNTER — Ambulatory Visit: Payer: Medicare Other

## 2021-01-20 ENCOUNTER — Telehealth: Payer: Self-pay

## 2021-01-20 DIAGNOSIS — Z006 Encounter for examination for normal comparison and control in clinical research program: Secondary | ICD-10-CM

## 2021-01-20 NOTE — Telephone Encounter (Signed)
Called patient for 90 day Identify phone call no answer, I attempted to leave a voicemail stating the intent of the phone call but the mailbox is full to provide our call back number to be reached in our department.

## 2021-07-16 ENCOUNTER — Other Ambulatory Visit: Payer: Self-pay | Admitting: Student

## 2021-07-16 DIAGNOSIS — F17210 Nicotine dependence, cigarettes, uncomplicated: Secondary | ICD-10-CM

## 2021-07-24 ENCOUNTER — Other Ambulatory Visit: Payer: Self-pay | Admitting: Student

## 2021-07-24 DIAGNOSIS — Z780 Asymptomatic menopausal state: Secondary | ICD-10-CM

## 2021-07-25 ENCOUNTER — Other Ambulatory Visit: Payer: Self-pay | Admitting: Student

## 2021-07-25 DIAGNOSIS — R928 Other abnormal and inconclusive findings on diagnostic imaging of breast: Secondary | ICD-10-CM

## 2021-08-20 ENCOUNTER — Encounter: Payer: Self-pay | Admitting: Gastroenterology

## 2021-09-02 ENCOUNTER — Telehealth: Payer: Self-pay | Admitting: *Deleted

## 2021-09-02 NOTE — Telephone Encounter (Signed)
Patient returned call, I advised patient that she needs cardiac clearance. Patient understood and stated that she will make an appointment with them.  ?

## 2021-09-02 NOTE — Telephone Encounter (Signed)
Pt having cardiac issues in 2022, DOE, palpitations, chest pain and tightness.  Did not follow-up to complete ECHO.  No further cardiac workup completed. Pt will need cardiac clearance before procedure here. PV and colon cancelled for now, called pt. No answer, voicemail full.  Will continue to try and reach pt. ?

## 2021-09-03 NOTE — Telephone Encounter (Signed)
Spoke with patient and got her an appointment with Almyra Deforest, on 09/18/21 at 1:55pm.  ? ?Patient agreeable and voiced understanding.  ?

## 2021-09-03 NOTE — Telephone Encounter (Signed)
Patient called and stated that she got in touch with her cardiologist and they told her that their appointments were booked far out that we could request medical clearance form. ? ?Fax number: ?[phone]. ?

## 2021-09-03 NOTE — Telephone Encounter (Signed)
Primary Cardiologist:David Ellyn Hack, MD ? ?Chart reviewed as part of pre-operative protocol coverage. Because of [name]'s past medical history and time since last visit, he/she will require a follow-up visit in order to better assess preoperative cardiovascular risk. ? ?Pre-op covering staff: ?- Please schedule appointment and call patient to inform them. ?- Please contact requesting surgeon's office via preferred method (i.e, phone, fax) to inform them of need for appointment prior to surgery. ? ?If applicable, this message will also be routed to pharmacy pool and/or primary cardiologist for input on holding anticoagulant/antiplatelet agent as requested below so that this information is available at time of patient's appointment.  ? ?Deberah Pelton, NP  ?09/03/2021, 10:02 AM  ? ?

## 2021-09-03 NOTE — Telephone Encounter (Signed)
[facility] Pre-operative Risk Assessment  ?   ?Request for surgical clearance:     Endoscopy Procedure ? ?What type of surgery is being performed?     colonoscopy ? ?When is this surgery scheduled?     TBA ? ?What type of clearance is required ?  Cardiac ? ?Are there any medications that need to be held prior to surgery and how long? no ? ?Practice name and name of physician performing surgery?      [facility]/Dr Danis ? ?What is your office phone and fax number?      Phone- [phone]  Fax- [phone] ? ?Anesthesia type MAC ?

## 2021-09-18 ENCOUNTER — Encounter: Payer: Self-pay | Admitting: Physician Assistant

## 2021-09-18 ENCOUNTER — Ambulatory Visit (INDEPENDENT_AMBULATORY_CARE_PROVIDER_SITE_OTHER): Payer: Medicare Other | Admitting: Physician Assistant

## 2021-09-18 ENCOUNTER — Other Ambulatory Visit: Payer: Self-pay

## 2021-09-18 VITALS — BP 124/72 | HR 72 | Ht 63.0 in | Wt 122.6 lb

## 2021-09-18 DIAGNOSIS — E785 Hyperlipidemia, unspecified: Secondary | ICD-10-CM

## 2021-09-18 DIAGNOSIS — I739 Peripheral vascular disease, unspecified: Secondary | ICD-10-CM

## 2021-09-18 DIAGNOSIS — Z01818 Encounter for other preprocedural examination: Secondary | ICD-10-CM

## 2021-09-18 NOTE — Patient Instructions (Signed)
Medication Instructions:  ?Your physician recommends that you continue on your current medications as directed. Please refer to the Current Medication list given to you today. ? ?*If you need a refill on your cardiac medications before your next appointment, please call your pharmacy* ? ?Lab Work: ?NONE ordered at this time of appointment  ? ?If you have labs (blood work) drawn today and your tests are completely normal, you will receive your results only by: ?MyChart Message (if you have MyChart) OR ?A paper copy in the mail ?If you have any lab test that is abnormal or we need to change your treatment, we will call you to review the results. ? ?Testing/Procedures: ?NONE ordered at this time of appointment  ? ?Follow-Up: ?At [facility], you and your health needs are our priority.  As part of our continuing mission to provide you with exceptional heart care, we have created designated Provider Care Teams.  These Care Teams include your primary Cardiologist (physician) and Advanced Practice Providers (APPs -  Physician Assistants and Nurse Practitioners) who all work together to provide you with the care you need, when you need it. ? ?We recommend signing up for the patient portal called "MyChart".  Sign up information is provided on this After Visit Summary.  MyChart is used to connect with patients for Virtual Visits (Telemedicine).  Patients are able to view lab/test results, encounter notes, upcoming appointments, etc.  Non-urgent messages can be sent to your provider as well.   ?To learn more about what you can do with MyChart, go to [email or web address].   ? ?Your next appointment:   ?6 month(s) ? ?The format for your next appointment:   ?In Person ? ?Provider:   ?Glenetta Hew, MD   ? ?Other Instructions ? ? ?

## 2021-09-18 NOTE — Progress Notes (Signed)
?Cardiology Office Note:   ? ?Date:  09/21/2021  ? ?IDShakendra, [name] 1956/01/17, MRN [id number] ? ?PCP:  Kendra Mile, NP ?  ?[name] Providers ?Cardiologist:  Kendra Hew, MD    ? ?Referring MD: Kendra Mile, NP  ? ?Chief Complaint  ?Patient presents with  ? Follow-up  ?  Seen for Dr. Ellyn Eaton  ? ? ?History of Present Illness:   ? ?[name] is a 66 y.o. female with a hx of tobacco abuse, PAD, hyperlipidemia and history of valvular cancer s/p radical vulvectomy.  Patient was seen by Dr. Ellyn Eaton in March 2022 for evaluation of atypical chest pain.  Based on the previous note, it was suspected her chest pain was more related to acid reflux.  She was started on low-dose metoprolol, Lipitor was increased to 40 mg daily. A coronary CTA was ordered but never done. Last ABI obtained in April 2022 showed right ABI 0.59, left ABI 0.95.  Patient is being followed by vascular surgery.  Her claudication symptom does not appears to be life limiting based on Dr. Claretha Eaton evaluation in April 2022.  ? ?Since last year, she has not had any significant chest discomfort.  Instead of using elevator, she actually climbed up the stairs to come to our office today.  She denies any exertional type of chest discomfort.  She does have significant allergies at this time of the year.  Given lack of chest discomfort and there is a low risk nature of the colonoscopy, she is cleared to proceed from a cardiac perspective.  If needed, she may hold aspirin for 5 days prior to the procedure and restart as once possible afterward at the surgeon's discretion.  She may follow-up with Dr. Ellyn Eaton in 6 months, earlier if she has any chest discomfort. ? ?Past Medical History:  ?Diagnosis Date  ? Cigarette smoker one half pack a day or less   ? ~ 10 cigarrettes/day  ? Complication of anesthesia   ? Depression   ? GERD (gastroesophageal reflux disease)   ? Hyperlipidemia 05/2020  ? Recently started on atorvastatin  ? PONV (postoperative  nausea and vomiting)   ? Prediabetes   ? Vulvar cancer ([place]) 2009  ? Radical vulvectomy and left side in July 2009.  Recurrence in 2010-reexcision June 2010.  Last follow-up in 2016.  ? ? ?Past Surgical History:  ?Procedure Laterality Date  ? ABDOMINAL HYSTERECTOMY  03/11/2006  ? For uterine myoma  ? COLONOSCOPY  08/03/2011  ? Procedure: COLONOSCOPY;  Surgeon: Kendra Horner, MD;  Location: [facility];  Service: Endoscopy;  Laterality: N/A;  ? RADICAL VULVECTOMY  12/2007  ? Laser vulvectomy; repeat procedure in 2010  ? ? ?Current Medications: ?Current Meds  ?Medication Sig  ? aspirin 81 MG tablet Take 81 mg by mouth every morning.   ? atorvastatin (LIPITOR) 20 MG tablet Take 1 tablet by mouth daily.  ? diclofenac sodium (VOLTAREN) 1 % GEL APPLY 4 GRAMS TO THE AFFECTED AREA(S) BY TOPICAL ROUTE 4 TIMES PER DAY FOR 30 DAYS  ? estradiol (CLIMARA) 0.025 mg/24hr patch Place 1 patch (0.025 mg total) onto the skin once a week.  ? metoprolol succinate (TOPROL XL) 25 MG 24 hr tablet Take 1 tablet (25 mg total) by mouth daily.  ? Multiple Vitamins-Minerals (CENTRUM VITAMINTS PO) Take by mouth daily.  ?  ? ?Allergies:   Codeine  ? ?Social History  ? ?Socioeconomic History  ? Marital status: Divorced  ?  Spouse name: Not  on file  ? Number of children: 2  ? Years of education: Not on file  ? Highest education level: Not on file  ?Occupational History  ?  Employer: UNEMPLOYED  ?Tobacco Use  ? Smoking status: Every Day  ?  Packs/day: 0.50  ?  Years: 20.00  ?  Pack years: 10.00  ?  Types: Cigarettes  ? Smokeless tobacco: Never  ?Vaping Use  ? Vaping Use: Never used  ?Substance and Sexual Activity  ? Alcohol use: No  ? Drug use: Yes  ?  Types: Marijuana  ?  Comment: Maybe a couple times a month  ? Sexual activity: Not Currently  ?Other Topics Concern  ? Not on file  ?Social History Narrative  ? Currently unemployed.  ? Smokes 10 cigarettes/day  ? Occasional smokes marijuana-used to help with pain.  ? Divorced with 2 children 6   ? ?Social Determinants of Health  ? ?Financial Resource Strain: Not on file  ?Food Insecurity: Not on file  ?Transportation Needs: Not on file  ?Physical Activity: Not on file  ?Stress: Not on file  ?Social Connections: Not on file  ?  ? ?Family History: ?The patient's family history includes Heart disease in her mother; Hypertension in her mother and sister; Prostate cancer in her brother; Stroke (age of onset: 76) in her mother. ? ?ROS:   ?Please see the history of present illness.    ? All other systems reviewed and are negative. ? ?EKGs/Labs/Other Studies Reviewed:   ? ?The following studies were reviewed today: ? ?N/A ? ?EKG:  EKG is ordered today.  The ekg ordered today demonstrates normal sinus rhythm, no significant ST-T wave changes ? ?Recent Labs: ?No results found for requested labs within last 8760 hours.  ?Recent Lipid Panel ?   ?Component Value Date/Time  ? CHOL 214 (H) 02/23/2010 2151  ? TRIG 118 02/23/2010 2151  ? HDL 47 02/23/2010 2151  ? CHOLHDL 4.6 Ratio 02/23/2010 2151  ? VLDL 24 02/23/2010 2151  ? LDLCALC 143 (H) 02/23/2010 2151  ? ? ? ?Risk Assessment/Calculations:   ?  ? ?    ? ?Physical Exam:   ? ?VS:  BP 124/72   Pulse 72   Ht '5\' 3"'$  (1.6 m)   Wt 122 lb 9.6 oz (55.6 kg)   SpO2 98%   BMI 21.72 kg/m?    ? ?Wt Readings from Last 3 Encounters:  ?09/18/21 122 lb 9.6 oz (55.6 kg)  ?10/06/20 132 lb 12.8 oz (60.2 kg)  ?08/27/20 132 lb 12.8 oz (60.2 kg)  ?  ? ?GEN:  Well nourished, well developed in no acute distress ?HEENT: Normal ?NECK: No JVD; No carotid bruits ?LYMPHATICS: No lymphadenopathy ?CARDIAC: RRR, no murmurs, rubs, gallops ?RESPIRATORY:  Clear to auscultation without rales, wheezing or rhonchi  ?ABDOMEN: Soft, non-tender, non-distended ?MUSCULOSKELETAL:  No edema; No deformity  ?SKIN: Warm and dry ?NEUROLOGIC:  Alert and oriented x 3 ?PSYCHIATRIC:  Normal affect  ? ?ASSESSMENT:   ? ?1. Preoperative clearance   ?2. PAD (peripheral artery disease) ([place])   ?3. Hyperlipidemia LDL goal  <70   ? ?PLAN:   ? ?In order of problems listed above: ? ?Preoperative clearance: Patient has upcoming colonoscopy procedure.  She was previously seen by Dr. Ellyn Eaton for atypical chest pain, a coronary CT was ordered however never done.  Patient was able to climb up to the second floor via stairs to her visit today without chest pain or worsening dyspnea.  Given her fair functional ability  without obvious anginal symptoms, she is cleared to proceed with GI procedure.  If needed, she may hold aspirin for 5 days prior to the procedure and restart as soon as possible afterward at the surgeon's discretion. ? ?PAD: Previously evaluated by Dr. Donzetta Matters, was not felt to be lifestyle limiting, therefore medical therapy was recommended. ? ?Hyperlipidemia: On Lipitor. ? ?   ? ?   ? ? ?Medication Adjustments/Labs and Tests Ordered: ?Current medicines are reviewed at length with the patient today.  Concerns regarding medicines are outlined above.  ?Orders Placed This Encounter  ?Procedures  ? EKG 12-Lead  ? ?No orders of the defined types were placed in this encounter. ? ? ?Patient Instructions  ?Medication Instructions:  ?Your physician recommends that you continue on your current medications as directed. Please refer to the Current Medication list given to you today. ? ?*If you need a refill on your cardiac medications before your next appointment, please call your pharmacy* ? ?Lab Work: ?NONE ordered at this time of appointment  ? ?If you have labs (blood work) drawn today and your tests are completely normal, you will receive your results only by: ?MyChart Message (if you have MyChart) OR ?A paper copy in the mail ?If you have any lab test that is abnormal or we need to change your treatment, we will call you to review the results. ? ?Testing/Procedures: ?NONE ordered at this time of appointment  ? ?Follow-Up: ?At [facility], you and your health needs are our priority.  As part of our continuing mission to provide you with  exceptional heart care, we have created designated Provider Care Teams.  These Care Teams include your primary Cardiologist (physician) and Advanced Practice Providers (APPs -  Physician Assistants and Nurse Practi

## 2021-09-21 ENCOUNTER — Encounter: Payer: Self-pay | Admitting: Physician Assistant

## 2021-09-23 ENCOUNTER — Encounter: Payer: Self-pay | Admitting: Gastroenterology

## 2021-09-29 NOTE — Progress Notes (Signed)
?Office Note  ? ? ? ?CC:  follow up ?Requesting Provider:  Cipriano Mile, NP ? ?HPI: [name] is a 66 y.o. (1955/09/09) female who presents for follow up of peripheral artery disease. She was seen initially by Dr. Donzetta Matters in April of 2022. At the time she had short distance claudication of 50 yards, but not lifestyle limiting. She was recommended to continue Asprin and statin and work on smoking cessation.  ? ?She returns today for 1 year follow up with non invasive studies. She is still having some bilateral lower extremity pain. She explains that this occurs on ambulation and at rest. She explains that it happens in both legs about equally from her thighs to her ankles. She describes it as throbbing pain and also tightness.She says at times it can be very painful and disabling and other times it barely bothers her.  She has no tissue loss. She is still currently smoking 1/2-1 ppd. She says she is still working on stopping.  ? ?The pt is on a statin for cholesterol management.  ?The pt is on a daily aspirin.   Other AC:  none ?The pt is on BB for hypertension.   ?The pt is not diabetic.   ?Tobacco hx:  current ? ?Past Medical History:  ?Diagnosis Date  ? Cigarette smoker one half pack a day or less   ? ~ 10 cigarrettes/day  ? Complication of anesthesia   ? Depression   ? GERD (gastroesophageal reflux disease)   ? Hyperlipidemia 05/2020  ? Recently started on atorvastatin  ? PONV (postoperative nausea and vomiting)   ? Prediabetes   ? Vulvar cancer ([place]) 2009  ? Radical vulvectomy and left side in July 2009.  Recurrence in 2010-reexcision June 2010.  Last follow-up in 2016.  ? ? ?Past Surgical History:  ?Procedure Laterality Date  ? ABDOMINAL HYSTERECTOMY  03/11/2006  ? For uterine myoma  ? COLONOSCOPY  08/03/2011  ? Procedure: COLONOSCOPY;  Surgeon: Wonda Horner, MD;  Location: [facility];  Service: Endoscopy;  Laterality: N/A;  ? RADICAL VULVECTOMY  12/2007  ? Laser vulvectomy; repeat procedure in 2010   ? ? ?Social History  ? ?Socioeconomic History  ? Marital status: Divorced  ?  Spouse name: Not on file  ? Number of children: 2  ? Years of education: Not on file  ? Highest education level: Not on file  ?Occupational History  ?  Employer: UNEMPLOYED  ?Tobacco Use  ? Smoking status: Every Day  ?  Packs/day: 0.50  ?  Years: 20.00  ?  Pack years: 10.00  ?  Types: Cigarettes  ? Smokeless tobacco: Never  ?Vaping Use  ? Vaping Use: Never used  ?Substance and Sexual Activity  ? Alcohol use: No  ? Drug use: Yes  ?  Types: Marijuana  ?  Comment: Maybe a couple times a month  ? Sexual activity: Not Currently  ?Other Topics Concern  ? Not on file  ?Social History Narrative  ? Currently unemployed.  ? Smokes 10 cigarettes/day  ? Occasional smokes marijuana-used to help with pain.  ? Divorced with 2 children 6  ? ?Social Determinants of Health  ? ?Financial Resource Strain: Not on file  ?Food Insecurity: Not on file  ?Transportation Needs: Not on file  ?Physical Activity: Not on file  ?Stress: Not on file  ?Social Connections: Not on file  ?Intimate Partner Violence: Not on file  ? ? ?Family History  ?Problem Relation Age of Onset  ?  Hypertension Mother   ? Stroke Mother 28  ? Heart disease Mother   ?     She is not aware of any details.  ? Prostate cancer Brother   ? Hypertension Sister   ? ? ?Current Outpatient Medications  ?Medication Sig Dispense Refill  ? aspirin 81 MG tablet Take 81 mg by mouth every morning.     ? atorvastatin (LIPITOR) 20 MG tablet Take 1 tablet by mouth daily.    ? diclofenac sodium (VOLTAREN) 1 % GEL APPLY 4 GRAMS TO THE AFFECTED AREA(S) BY TOPICAL ROUTE 4 TIMES PER DAY FOR 30 DAYS    ? estradiol (CLIMARA) 0.025 mg/24hr patch Place 1 patch (0.025 mg total) onto the skin once a week. 8 patch 3  ? metoprolol succinate (TOPROL XL) 25 MG 24 hr tablet Take 1 tablet (25 mg total) by mouth daily. 90 tablet 3  ? Multiple Vitamins-Minerals (CENTRUM VITAMINTS PO) Take by mouth daily.    ? ?No current  facility-administered medications for this visit.  ? ? ?Allergies  ?Allergen Reactions  ? Codeine Nausea Only  ?  REACTION: Nausea  ? ? ? ?REVIEW OF SYSTEMS:  ?'[X]'$  denotes positive finding, '[ ]'$  denotes negative finding ?Cardiac  Comments:  ?Chest pain or chest pressure:    ?Shortness of breath upon exertion:    ?Short of breath when lying flat:    ?Irregular heart rhythm:    ?    ?Vascular    ?Pain in calf, thigh, or hip brought on by ambulation:    ?Pain in feet at night that wakes you up from your sleep:     ?Blood clot in your veins:    ?Leg swelling:     ?    ?Pulmonary    ?Oxygen at home:    ?Productive cough:     ?Wheezing:     ?    ?Neurologic    ?Sudden weakness in arms or legs:     ?Sudden numbness in arms or legs:     ?Sudden onset of difficulty speaking or slurred speech:    ?Temporary loss of vision in one eye:     ?Problems with dizziness:     ?    ?Gastrointestinal    ?Blood in stool:     ?Vomited blood:     ?    ?Genitourinary    ?Burning when urinating:     ?Blood in urine:    ?    ?Psychiatric    ?Major depression:     ?    ?Hematologic    ?Bleeding problems:    ?Problems with blood clotting too easily:    ?    ?Skin    ?Rashes or ulcers:    ?    ?Constitutional    ?Fever or chills:    ? ? ?PHYSICAL EXAMINATION: ? ?Vitals:  ? 09/30/21 1435  ?BP: 124/81  ?Pulse: 81  ?Temp: (!) 93.3 ?F (34.1 ?C)  ?Weight: 122 lb 9.6 oz (55.6 kg)  ?Height: '5\' 3"'$  (1.6 m)  ? ? ?General:  WDWN in NAD; vital signs documented above ?Gait: Normal ?HENT: WNL, normocephalic ?Pulmonary: normal non-labored breathing , without wheezing ?Cardiac: regular HR, without  Murmurs without carotid bruit ?Abdomen: soft, NT, no masses ? ?Vascular Exam/Pulses: ? Right Left  ?Radial 2+ (normal) 2+ (normal)  ?Femoral 2+ (normal) 2+ (normal)  ?Popliteal Not palpable Not palpable  ?DP 2+ (normal) 2+ (normal)  ?PT absent 2+ (normal)  ? ?Extremities: without ischemic changes, without  Gangrene , without cellulitis; without open wounds;   ?Musculoskeletal: no muscle wasting or atrophy  ?Neurologic: A&O X 3;  No focal weakness or paresthesias are detected ?Psychiatric:  The pt has Normal affect. ? ? ?Non-Invasive Vascular Imaging:   ?+-------+-----------+-----------+------------+------------+  ?ABI/TBIToday's ABIToday's TBIPrevious ABIPrevious TBI  ?+-------+-----------+-----------+------------+------------+  ?Right  0.46       0.27       0.59        0.25          ?+-------+-----------+-----------+------------+------------+  ?Left   0.81       0.71       0.95        0.78          ?+-------+-----------+-----------+------------+------------+  ? ?Monophasic flow in RLE with toe pressure of 38  ?Biphasic flow in LLE with toe pressure of 99 ? ?ASSESSMENT/PLAN:: 66 y.o. female here for follow up for PAD. Her symptoms are essentially unchanged. I do not think that her symptoms are entirely from her arterial disease. She does continue to have claudication like symptoms but these are not disabling. She continues to have palpable pedal pulses bilaterally. No rest pain or tissue loss. ABI's today are decreased bilaterally, TBI's unchanged.  ?- encourage smoking cessation  ?- continuing aspirin and statin ?- I discussed with her angiography if her symptoms continue to worsen but I have encouraged her to continue medical management and lifestyle changes at this time. if she is still having significant symptoms at her next visit, can consider angiography  ?-  We will see her in 3 months for follow up with ABI ?  ? ?Karoline Caldwell, PA-C ?[facility] ?[phone] ? ?Clinic MD:   [facility]  ?

## 2021-09-30 ENCOUNTER — Encounter: Payer: Self-pay | Admitting: Physician Assistant

## 2021-09-30 ENCOUNTER — Ambulatory Visit (INDEPENDENT_AMBULATORY_CARE_PROVIDER_SITE_OTHER): Payer: Medicare Other | Admitting: Physician Assistant

## 2021-09-30 ENCOUNTER — Ambulatory Visit (HOSPITAL_COMMUNITY)
Admission: RE | Admit: 2021-09-30 | Discharge: 2021-09-30 | Disposition: A | Discharge status: Home / Self Care | Payer: Medicare Other | Source: Ambulatory Visit | Attending: Vascular Surgery | Admitting: Vascular Surgery

## 2021-09-30 VITALS — BP 124/81 | HR 81 | Temp 93.3°F | Ht 63.0 in | Wt 122.6 lb

## 2021-09-30 DIAGNOSIS — I739 Peripheral vascular disease, unspecified: Secondary | ICD-10-CM | POA: Diagnosis present

## 2021-10-07 ENCOUNTER — Encounter: Payer: Medicare Other | Admitting: Gastroenterology

## 2021-10-12 ENCOUNTER — Other Ambulatory Visit (INDEPENDENT_AMBULATORY_CARE_PROVIDER_SITE_OTHER): Payer: Medicare Other

## 2021-10-12 ENCOUNTER — Other Ambulatory Visit: Payer: Self-pay | Admitting: *Deleted

## 2021-10-12 ENCOUNTER — Encounter: Payer: Self-pay | Admitting: Gastroenterology

## 2021-10-12 ENCOUNTER — Ambulatory Visit (INDEPENDENT_AMBULATORY_CARE_PROVIDER_SITE_OTHER): Payer: Medicare Other | Admitting: Gastroenterology

## 2021-10-12 VITALS — BP 130/60 | HR 72 | Ht 62.0 in | Wt 122.5 lb

## 2021-10-12 DIAGNOSIS — I739 Peripheral vascular disease, unspecified: Secondary | ICD-10-CM

## 2021-10-12 DIAGNOSIS — R195 Other fecal abnormalities: Secondary | ICD-10-CM

## 2021-10-12 DIAGNOSIS — R0789 Other chest pain: Secondary | ICD-10-CM

## 2021-10-12 DIAGNOSIS — R194 Change in bowel habit: Secondary | ICD-10-CM | POA: Diagnosis not present

## 2021-10-12 DIAGNOSIS — R1319 Other dysphagia: Secondary | ICD-10-CM

## 2021-10-12 LAB — CBC
HCT: 39.2 % (ref 36.0–46.0)
Hemoglobin: 13.3 g/dL (ref 12.0–15.0)
MCHC: 33.9 g/dL (ref 30.0–36.0)
MCV: 97.2 fl (ref 78.0–100.0)
Platelets: 290 10*3/uL (ref 150.0–400.0)
RBC: 4.04 Mil/uL (ref 3.87–5.11)
RDW: 15.4 % (ref 11.5–15.5)
WBC: 7.1 10*3/uL (ref 4.0–10.5)

## 2021-10-12 LAB — IBC + FERRITIN
Ferritin: 53.8 ng/mL (ref 10.0–291.0)
Iron: 36 ug/dL — ABNORMAL LOW (ref 42–145)
Saturation Ratios: 12 % — ABNORMAL LOW (ref 20.0–50.0)
TIBC: 301 ug/dL (ref 250.0–450.0)
Transferrin: 215 mg/dL (ref 212.0–360.0)

## 2021-10-12 MED ORDER — NA SULFATE-K SULFATE-MG SULF 17.5-3.13-1.6 GM/177ML PO SOLN: 1.0000 | Freq: Once | ORAL | 0 refills | Status: AC

## 2021-10-12 NOTE — Patient Instructions (Signed)
If you are age 68 or older, your body mass index should be between 23-30. Your Body mass index is 22.41 kg/m?Marland Kitchen If this is out of the aforementioned range listed, please consider follow up with your Primary Care Provider. ?________________________________________________________ ? ?The [facility] providers would like to encourage you to use [facility] to communicate with providers for non-urgent requests or questions.  Due to long hold times on the telephone, sending your provider a message by [facility] may be a faster and more efficient way to get a response.  Please allow 48 business hours for a response.  Please remember that this is for non-urgent requests.  ?_______________________________________________________ ? ?You have been scheduled for an endoscopy and colonoscopy. Please follow the written instructions given to you at your visit today. ?Please pick up your prep supplies at the pharmacy within the next 1-3 days. ?If you use inhalers (even only as needed), please bring them with you on the day of your procedure. ? ?Your provider has requested that you go to the basement level for lab work before leaving today. Press "B" on the elevator. The lab is located at the first door on the left as you exit the elevator. ? ?Follow up pending the results of your Endoscopy/Colonoscopy. ? ?It was a pleasure to see you today! ? ?Thank you for trusting me with your gastrointestinal care!   ? ? ?

## 2021-10-12 NOTE — Progress Notes (Signed)
? ? ?[facility] Consult Note: ? ?History: ?[name] ?10/12/2021 ? ?Referring provider: Cipriano Mile, NP ? ?Reason for consult/chief complaint: Colon Polyps (To discuss having a colonoscopy ), heme positive stool test, and Diarrhea (Off and on) ? ? ?Subjective  ?HPI: ? ?[name] is a pleasant 66 year old woman referred by primary care for heme positive stool test done September 2022.  She reports that a nurse from her insurance company came to her house and recommended she do the stool test, which reported positive. ?Her bowel habits are somewhat irregular and occasionally loose.  She has not noticed any blood in the stool.  Danyele has intermittent nonexertional chest pain described in the cardiology excerpt below, she thinks it may be heartburn because it is sometimes of a burning quality.  She has some difficulty swallowing pills with a feel hung up or stuck in the neck or upper chest.  Her appetite is unchanged and she does not know if she is lost any weight but thinks some family members have suggested that she has. ? ? ?ROS: ? ?Review of Systems  ?Constitutional:  Negative for appetite change and unexpected weight change.  ?HENT:  Negative for mouth sores and voice change.   ?Eyes:  Negative for pain and redness.  ?Respiratory:  Positive for cough. Negative for shortness of breath.   ?Cardiovascular:  Negative for chest pain and palpitations.  ?     Claudication symptoms  ?Genitourinary:  Negative for dysuria and hematuria.  ?Musculoskeletal:  Negative for arthralgias and myalgias.  ?Skin:  Negative for pallor and rash.  ?Neurological:  Negative for weakness and headaches.  ?Hematological:  Negative for adenopathy.  ? ? ?Past Medical History: ?Past Medical History:  ?Diagnosis Date  ? Adenomatous colon polyp   ? Angina pectoris ([place])   ? Anxiety   ? Atherosclerosis   ? Cigarette smoker one half pack a day or less   ? ~ 10 cigarrettes/day  ? Complication of anesthesia   ? Depression   ? GERD  (gastroesophageal reflux disease)   ? Hyperlipidemia   ? Osteoarthritis   ? PONV (postoperative nausea and vomiting)   ? Prediabetes   ? Vulvar cancer ([place]) 2009  ? Radical vulvectomy and left side in July 2009.  Recurrence in 2010-reexcision June 2010.  Last follow-up in 2016.  ? ?From cardiology office note dated 09/18/2021: ?"[name] is a 66 y.o. female with a hx of tobacco abuse, PAD, hyperlipidemia and history of valvular cancer s/p radical vulvectomy.  Patient was seen by Dr. Ellyn Hack in March 2022 for evaluation of atypical chest pain.  Based on the previous note, it was suspected her chest pain was more related to acid reflux.  She was started on low-dose metoprolol, Lipitor was increased to 40 mg daily. A coronary CTA was ordered but never done. Last ABI obtained in April 2022 showed right ABI 0.59, left ABI 0.95.  Patient is being followed by vascular surgery.  Her claudication symptom does not appears to be life limiting based on Dr. Claretha Cooper evaluation in April 2022.  ?  ?Since last year, she has not had any significant chest discomfort.  Instead of using elevator, she actually climbed up the stairs to come to our office today.  She denies any exertional type of chest discomfort.  She does have significant allergies at this time of the year.  Given lack of chest discomfort and there is a low risk nature of the colonoscopy, she is cleared to proceed from  a cardiac perspective.  If needed, she may hold aspirin for 5 days prior to the procedure and restart as once possible afterward at the surgeon's discretion.  She may follow-up with Dr. Ellyn Hack in 6 months, earlier if she has any chest discomfort" ? ? ?Past Surgical History: ?Past Surgical History:  ?Procedure Laterality Date  ? ABDOMINAL HYSTERECTOMY  03/11/2006  ? For uterine myoma  ? COLONOSCOPY  08/03/2011  ? Procedure: COLONOSCOPY;  Surgeon: Wonda Horner, MD;  Location: [facility];  Service: Endoscopy;  Laterality: N/A;  ? RADICAL VULVECTOMY   12/2007  ? Laser vulvectomy; repeat procedure in 2010  ? ? ? ?Family History: ?Family History  ?Problem Relation Age of Onset  ? Hypertension Mother   ? Stroke Mother 16  ? Heart disease Mother   ?     She is not aware of any details.  ? Hypertension Sister   ? Hypertension Sister   ? Prostate cancer Brother   ? Hypertension Brother   ? Hypertension Brother   ? Hypertension Brother   ? ? ?Social History: ?Social History  ? ?Socioeconomic History  ? Marital status: Divorced  ?  Spouse name: Not on file  ? Number of children: 2  ? Years of education: Not on file  ? Highest education level: Not on file  ?Occupational History  ?  Employer: UNEMPLOYED  ? Occupation: disabled  ?Tobacco Use  ? Smoking status: Every Day  ?  Packs/day: 0.50  ?  Years: 20.00  ?  Pack years: 10.00  ?  Types: Cigarettes  ? Smokeless tobacco: Never  ?Vaping Use  ? Vaping Use: Never used  ?Substance and Sexual Activity  ? Alcohol use: No  ? Drug use: Yes  ?  Types: Marijuana  ?  Comment: Maybe a couple times a month  ? Sexual activity: Not Currently  ?Other Topics Concern  ? Not on file  ?Social History Narrative  ? Currently unemployed.  ? Smokes 10 cigarettes/day  ? Occasional smokes marijuana-used to help with pain.  ? Divorced with 2 children 6  ? ?Social Determinants of Health  ? ?Financial Resource Strain: Not on file  ?Food Insecurity: Not on file  ?Transportation Needs: Not on file  ?Physical Activity: Not on file  ?Stress: Not on file  ?Social Connections: Not on file  ? ?Still smoking ? ? ?Allergies: ?Allergies  ?Allergen Reactions  ? Codeine Nausea Only  ?  REACTION: Nausea  ? ? ?Outpatient Meds: ?Current Outpatient Medications  ?Medication Sig Dispense Refill  ? aspirin 81 MG tablet Take 81 mg by mouth every morning.     ? atorvastatin (LIPITOR) 20 MG tablet Take 1 tablet by mouth daily.    ? estradiol (CLIMARA) 0.025 mg/24hr patch Place 1 patch (0.025 mg total) onto the skin once a week. 8 patch 3  ? metoprolol succinate (TOPROL XL)  25 MG 24 hr tablet Take 1 tablet (25 mg total) by mouth daily. 90 tablet 3  ? Multiple Vitamins-Minerals (CENTRUM VITAMINTS PO) Take by mouth daily.    ? diclofenac sodium (VOLTAREN) 1 % GEL APPLY 4 GRAMS TO THE AFFECTED AREA(S) BY TOPICAL ROUTE 4 TIMES PER DAY FOR 30 DAYS (Patient not taking: Reported on 10/12/2021)    ? ?No current facility-administered medications for this visit.  ? ? ? ? ?___________________________________________________________________ ?Objective  ? ?Exam: ? ?BP 130/60 (BP Location: Left Arm, Patient Position: Sitting, Cuff Size: Normal)   Pulse 72   Ht '5\' 2"'$  (1.575 m)  Comment: measured without shoes  Wt 122 lb 8 oz (55.6 kg)   BMI 22.41 kg/m?  ?Wt Readings from Last 3 Encounters:  ?10/12/21 122 lb 8 oz (55.6 kg)  ?09/30/21 122 lb 9.6 oz (55.6 kg)  ?09/18/21 122 lb 9.6 oz (55.6 kg)  ? ? ?General: Thin, petite woman with normal vocal quality.  Ambulatory, gets on exam table without difficulty.  Breathing comfortably on room air. ?Eyes: sclera anicteric, no redness ?ENT: oral mucosa moist without lesions, no cervical or supraclavicular lymphadenopathy.  Upper dentures, edentulous lower ?CV: RRR without murmur, S1/S2, no JVD, no peripheral edema ?Resp: clear to auscultation bilaterally, normal RR and effort noted ?GI: soft, no tenderness, with active bowel sounds. No guarding or palpable organomegaly noted. ?Skin; warm and dry, no rash or jaundice noted ?Neuro: awake, alert and oriented x 3. Normal gross motor function and fluent speech ? ?Labs: ? ?Copy of lab report from 03/20/2021 showing heme positive stool.  This appears to be a letter from [employer]. ? ?Last colonoscopy report by Dr. Penelope Coop of [facility] from February 2013 indicates left-sided diverticulosis and subcentimeter left colon tubular adenoma removed. ? ?Assessment: ?Encounter Diagnoses  ?Name Primary?  ? Heme positive stool Yes  ? Esophageal dysphagia   ? Altered bowel habits   ? Other chest pain   ?History of  adenomatous colon polyp 10 years ago, overdue for surveillance, procedure now diagnostic for heme positive stool. ? ?Heme positive stool, intermittent loose stool, (difficult to characterize, limited health literacy).  C

## 2021-10-14 ENCOUNTER — Encounter: Payer: Self-pay | Admitting: Certified Registered Nurse Anesthetist

## 2021-10-15 ENCOUNTER — Ambulatory Visit (AMBULATORY_SURGERY_CENTER): Payer: Medicare Other | Admitting: Gastroenterology

## 2021-10-15 ENCOUNTER — Encounter: Payer: Self-pay | Admitting: Gastroenterology

## 2021-10-15 VITALS — BP 101/68 | HR 57 | Temp 97.5°F | Resp 6 | Ht 62.0 in | Wt 122.0 lb

## 2021-10-15 DIAGNOSIS — R1319 Other dysphagia: Secondary | ICD-10-CM

## 2021-10-15 DIAGNOSIS — R195 Other fecal abnormalities: Secondary | ICD-10-CM | POA: Diagnosis not present

## 2021-10-15 DIAGNOSIS — D120 Benign neoplasm of cecum: Secondary | ICD-10-CM | POA: Diagnosis not present

## 2021-10-15 DIAGNOSIS — D123 Benign neoplasm of transverse colon: Secondary | ICD-10-CM | POA: Diagnosis not present

## 2021-10-15 DIAGNOSIS — K2970 Gastritis, unspecified, without bleeding: Secondary | ICD-10-CM

## 2021-10-15 DIAGNOSIS — R194 Change in bowel habit: Secondary | ICD-10-CM

## 2021-10-15 MED ORDER — OMEPRAZOLE 20 MG PO CPDR: 20.0000 mg | DELAYED_RELEASE_CAPSULE | Freq: Two times a day (BID) | ORAL | 2 refills | Status: DC

## 2021-10-15 MED ORDER — SODIUM CHLORIDE 0.9 % IV SOLN: 500.0000 mL | Freq: Once | INTRAVENOUS | Status: DC

## 2021-10-15 NOTE — Progress Notes (Signed)
No changes to clinical history since GI office visit on 10/12/21. ?Hgb that day 13.3, iron low ? ?The patient is appropriate for an endoscopic procedure in the ambulatory setting. ? ?- Wilfrid Lund, MD ? ? ? ?

## 2021-10-15 NOTE — Progress Notes (Signed)
VS by DT  Pt's states no medical or surgical changes since previsit or office visit.  

## 2021-10-15 NOTE — Op Note (Signed)
[facility] ?Patient Name: [name] ?Procedure Date: 10/15/2021 2:50 PM ?MRN: [id number] ?Endoscopist: Estill Cotta. Loletha Carrow , MD ?Age: 66 ?Referring MD:  ?Date of Birth: 30-Nov-1955 ?Gender: Female ?Account #: [id number] ?Procedure:                Colonoscopy ?Indications:              Heme positive stool (in Sept 2022) ?                          recent Iron low at 36 with normal ferritin and  ?                          hemoglobin ?Medicines:                Monitored Anesthesia Care ?Procedure:                Pre-Anesthesia Assessment: ?                          - Prior to the procedure, a History and Physical  ?                          was performed, and patient medications and  ?                          allergies were reviewed. The patient's tolerance of  ?                          previous anesthesia was also reviewed. The risks  ?                          and benefits of the procedure and the sedation  ?                          options and risks were discussed with the patient.  ?                          All questions were answered, and informed consent  ?                          was obtained. Prior Anticoagulants: The patient has  ?                          taken no previous anticoagulant or antiplatelet  ?                          agents except for aspirin. ASA Grade Assessment:  ?                          III - A patient with severe systemic disease. After  ?                          reviewing the risks and benefits, the patient was  ?  deemed in satisfactory condition to undergo the  ?                          procedure. ?                          After obtaining informed consent, the colonoscope  ?                          was passed under direct vision. Throughout the  ?                          procedure, the patient's blood pressure, pulse, and  ?                          oxygen saturations were monitored continuously. The  ?                          Olympus PCF-H190DL  (#[id number]) Colonoscope was  ?                          introduced through the anus and advanced to the the  ?                          cecum, identified by appendiceal orifice and  ?                          ileocecal valve. The colonoscopy was performed  ?                          without difficulty. The patient tolerated the  ?                          procedure well. The quality of the bowel  ?                          preparation was good. The ileocecal valve,  ?                          appendiceal orifice, and rectum were photographed. ?Scope In: 2:55:43 PM ?Scope Out: 3:09:27 PM ?Scope Withdrawal Time: 0 hours 11 minutes 31 seconds  ?Total Procedure Duration: 0 hours 13 minutes 44 seconds  ?Findings:                 The digital rectal exam findings include decreased  ?                          sphincter tone. ?                          A diminutive polyp was found in the appendiceal  ?                          orifice. The polyp was semi-sessile. The polyp was  ?  removed with a cold biopsy forceps. Resection and  ?                          retrieval were complete. ?                          A 10 mm polyp was found in the proximal transverse  ?                          colon. The polyp was pedunculated with a shallow  ?                          ulcer at the tip. The polyp was removed en bloc  ?                          with a hot snare. Resection and retrieval were  ?                          complete (cold cut with snare for retrieval). ?                          Retroflexion in the rectum was not performed due to  ?                          anatomy. ?                          The exam was otherwise without abnormality. ?Complications:            No immediate complications. ?Estimated Blood Loss:     Estimated blood loss was minimal. ?Impression:               - Decreased sphincter tone found on digital rectal  ?                          exam. ?                          - One  diminutive polyp at the appendiceal orifice,  ?                          removed with a cold biopsy forceps. Resected and  ?                          retrieved. ?                          - One 10 mm polyp in the proximal transverse colon,  ?                          removed with a hot snare. Resected and retrieved. ?                          - The examination was otherwise normal. ?Recommendation:           -  Patient has a contact number available for  ?                          emergencies. The signs and symptoms of potential  ?                          delayed complications were discussed with the  ?                          patient. Return to normal activities tomorrow.  ?                          Written discharge instructions were provided to the  ?                          patient. ?                          - Resume previous diet. ?                          - Continue present medications. ?                          - Await pathology results. ?                          - Repeat colonoscopy is recommended for  ?                          surveillance. The colonoscopy date will be  ?                          determined after pathology results from today's  ?                          exam become available for review. ?                          - See the other procedure note for documentation of  ?                          additional recommendations. ?Sabin Gibeault L. Loletha Carrow, MD ?10/15/2021 3:31:28 PM ?This report has been signed electronically. ?

## 2021-10-15 NOTE — Patient Instructions (Addendum)
?**Start taking a slow release Iron tablet once a day for 4 weeks** ? ? ? ?YOU HAD AN ENDOSCOPIC PROCEDURE TODAY AT [facility]:   Refer to the procedure report that was given to you for any specific questions about what was found during the examination.  If the procedure report does not answer your questions, please call your gastroenterologist to clarify.  If you requested that your care partner not be given the details of your procedure findings, then the procedure report has been included in a sealed envelope for you to review at your convenience later. ? ?**Handouts given on gastritis and polyps** ? ?YOU SHOULD EXPECT: Some feelings of bloating in the abdomen. Passage of more gas than usual.  Walking can help get rid of the air that was put into your GI tract during the procedure and reduce the bloating. If you had a lower endoscopy (such as a colonoscopy or flexible sigmoidoscopy) you may notice spotting of blood in your stool or on the toilet paper. If you underwent a bowel prep for your procedure, you may not have a normal bowel movement for a few days. ? ?Please Note:  You might notice some irritation and congestion in your nose or some drainage.  This is from the oxygen used during your procedure.  There is no need for concern and it should clear up in a day or so. ? ?SYMPTOMS TO REPORT IMMEDIATELY: ? ?Following lower endoscopy (colonoscopy or flexible sigmoidoscopy): ? Excessive amounts of blood in the stool ? Significant tenderness or worsening of abdominal pains ? Swelling of the abdomen that is new, acute ? Fever of 100?F or higher ? ?Following upper endoscopy (EGD) ? Vomiting of blood or coffee ground material ? New chest pain or pain under the shoulder blades ? Painful or persistently difficult swallowing ? New shortness of breath ? Fever of 100?F or higher ? Black, tarry-looking stools ? ?For urgent or emergent issues, a gastroenterologist can be reached at any hour by calling  [phone]. ?Do not use MyChart messaging for urgent concerns.  ? ? ?DIET:  We do recommend a small meal at first, but then you may proceed to your regular diet.  Drink plenty of fluids but you should avoid alcoholic beverages for 24 hours. ? ?ACTIVITY:  You should plan to take it easy for the rest of today and you should NOT DRIVE or use heavy machinery until tomorrow (because of the sedation medicines used during the test).   ? ?FOLLOW UP: ?Our staff will call the number listed on your records 48-72 hours following your procedure to check on you and address any questions or concerns that you may have regarding the information given to you following your procedure. If we do not reach you, we will leave a message.  We will attempt to reach you two times.  During this call, we will ask if you have developed any symptoms of COVID 19. If you develop any symptoms (ie: fever, flu-like symptoms, shortness of breath, cough etc.) before then, please call [phone].  If you test positive for Covid 19 in the 2 weeks post procedure, please call and report this information to [place].   ? ?If any biopsies were taken you will be contacted by phone or by letter within the next 1-3 weeks.  Please call us at [phone] if you have not heard about the biopsies in 3 weeks.  ? ? ?SIGNATURES/CONFIDENTIALITY: ?You and/or your care partner have signed paperwork which  will be entered into your electronic medical record.  These signatures attest to the fact that that the information above on your After Visit Summary has been reviewed and is understood.  Full responsibility of the confidentiality of this discharge information lies with you and/or your care-partner.  ?

## 2021-10-15 NOTE — Progress Notes (Signed)
Report given to PACU, vss 

## 2021-10-15 NOTE — Progress Notes (Signed)
Called to room to assist during endoscopic procedure.  Patient ID and intended procedure confirmed with present staff. Received instructions for my participation in the procedure from the performing physician.  

## 2021-10-15 NOTE — Progress Notes (Signed)
1447 Robinul 0.1 mg IV given due large amount of secretions upon assessment.  MD made aware, vss  ?

## 2021-10-15 NOTE — Op Note (Signed)
[facility] ?Patient Name: [name] ?Procedure Date: 10/15/2021 2:49 PM ?MRN: [id number] ?Endoscopist: Estill Cotta. Loletha Carrow , MD ?Age: 66 ?Referring MD:  ?Date of Birth: 09/15/1955 ?Gender: Female ?Account #: [id number] ?Procedure:                Upper GI endoscopy ?Indications:              Esophageal dysphagia, Heme positive stool ?Medicines:                Monitored Anesthesia Care ?Procedure:                Pre-Anesthesia Assessment: ?                          - Prior to the procedure, a History and Physical  ?                          was performed, and patient medications and  ?                          allergies were reviewed. The patient's tolerance of  ?                          previous anesthesia was also reviewed. The risks  ?                          and benefits of the procedure and the sedation  ?                          options and risks were discussed with the patient.  ?                          All questions were answered, and informed consent  ?                          was obtained. Prior Anticoagulants: The patient has  ?                          taken no previous anticoagulant or antiplatelet  ?                          agents except for aspirin. ASA Grade Assessment:  ?                          III - A patient with severe systemic disease. After  ?                          reviewing the risks and benefits, the patient was  ?                          deemed in satisfactory condition to undergo the  ?                          procedure. ?  After obtaining informed consent, the endoscope was  ?                          passed under direct vision. Throughout the  ?                          procedure, the patient's blood pressure, pulse, and  ?                          oxygen saturations were monitored continuously. The  ?                          GIF [id number] #[id number] was introduced through the  ?                          mouth, and advanced to the second part of  duodenum.  ?                          The upper GI endoscopy was accomplished without  ?                          difficulty. The patient tolerated the procedure  ?                          well. ?Scope In: ?Scope Out: ?Findings:                 The distal esophagus was tortuous. ?                          There is no endoscopic evidence of Barrett's  ?                          esophagus, esophagitis, hiatal hernia or stricture  ?                          in the entire esophagus. ?                          Patchy inflammation characterized by adherent  ?                          blood, erosions, erythema and friability was found  ?                          in the entire examined stomach. Several biopsies  ?                          were obtained on the greater curvature of the  ?                          gastric body, on the lesser curvature of the  ?                          gastric body, on the greater curvature of the  ?  gastric antrum and on the lesser curvature of the  ?                          gastric antrum with cold forceps for histology. ?                          The cardia and gastric fundus were normal on  ?                          retroflexion. ?                          The examined duodenum was normal. ?Complications:            No immediate complications. ?Estimated Blood Loss:     Estimated blood loss was minimal. ?Impression:               - Tortuous esophagus. Appears to explain episodic  ?                          dysphagia. ?                          - Gastritis. Explains heme positive stool. ?                          - Normal examined duodenum. ?                          - Several biopsies were obtained on the greater  ?                          curvature of the gastric body, on the lesser  ?                          curvature of the gastric body, on the greater  ?                          curvature of the gastric antrum and on the lesser  ?                           curvature of the gastric antrum. ?                          If biopsies negative for H. pylori, then gastritis  ?                          is the result of '81mg'$  daily aspirin use and  ?                          smoking. Patient will need indefinite PPI use as  ?                          long as aspirin needed for PAD. ?Recommendation:           - Patient has  a contact number available for  ?                          emergencies. The signs and symptoms of potential  ?                          delayed complications were discussed with the  ?                          patient. Return to normal activities tomorrow.  ?                          Written discharge instructions were provided to the  ?                          patient. ?                          - Resume previous diet. ?                          - Follow up pathology results. ?                          - Continue present medications. ?                          - Use Prilosec (omeprazole) 20 mg PO BID for 4  ?                          weeks, then decrease to once daily indefinitely.  ?                          Disp #60, RF 2 ?                          - Please make every effort to stop smoking. ?                          Take a 65 mg slow release iron tablet once daily  ?                          for 4 weeks. ?                          See primary care to check CBC and iron studies in 2  ?                          months. If patient develops recurrent iron  ?                          deficiency, refer back to my office. ?Jermon Chalfant L. Loletha Carrow, MD ?10/15/2021 3:39:38 PM ?This report has been signed electronically. ?

## 2021-10-19 ENCOUNTER — Telehealth: Payer: Self-pay | Admitting: *Deleted

## 2021-10-19 NOTE — Telephone Encounter (Signed)
Second attempt, left VM.  

## 2021-10-22 ENCOUNTER — Encounter: Payer: Self-pay | Admitting: Gastroenterology

## 2021-11-04 ENCOUNTER — Other Ambulatory Visit: Payer: Self-pay | Admitting: Cardiology

## 2021-11-18 ENCOUNTER — Ambulatory Visit (INDEPENDENT_AMBULATORY_CARE_PROVIDER_SITE_OTHER): Payer: Medicare Other | Admitting: Family Medicine

## 2021-11-18 ENCOUNTER — Encounter: Payer: Self-pay | Admitting: Family Medicine

## 2021-11-18 ENCOUNTER — Other Ambulatory Visit (HOSPITAL_COMMUNITY)
Admission: RE | Admit: 2021-11-18 | Discharge: 2021-11-18 | Disposition: A | Discharge status: Home / Self Care | Payer: Medicare Other | Source: Ambulatory Visit | Attending: Family Medicine | Admitting: Family Medicine

## 2021-11-18 VITALS — BP 148/70 | HR 63 | Wt 122.4 lb

## 2021-11-18 DIAGNOSIS — Z716 Tobacco abuse counseling: Secondary | ICD-10-CM

## 2021-11-18 DIAGNOSIS — Z01419 Encounter for gynecological examination (general) (routine) without abnormal findings: Secondary | ICD-10-CM | POA: Diagnosis present

## 2021-11-18 DIAGNOSIS — Z1151 Encounter for screening for human papillomavirus (HPV): Secondary | ICD-10-CM | POA: Diagnosis not present

## 2021-11-18 DIAGNOSIS — F17210 Nicotine dependence, cigarettes, uncomplicated: Secondary | ICD-10-CM | POA: Diagnosis not present

## 2021-11-18 DIAGNOSIS — C519 Malignant neoplasm of vulva, unspecified: Secondary | ICD-10-CM | POA: Diagnosis not present

## 2021-11-18 DIAGNOSIS — Z79890 Hormone replacement therapy (postmenopausal): Secondary | ICD-10-CM | POA: Insufficient documentation

## 2021-11-18 DIAGNOSIS — Z1231 Encounter for screening mammogram for malignant neoplasm of breast: Secondary | ICD-10-CM | POA: Diagnosis not present

## 2021-11-18 DIAGNOSIS — I739 Peripheral vascular disease, unspecified: Secondary | ICD-10-CM

## 2021-11-18 MED ORDER — BUPROPION HCL ER (XL) 150 MG PO TB24: 150.0000 mg | ORAL_TABLET | Freq: Every day | ORAL | 3 refills | Status: AC

## 2021-11-18 MED ORDER — NICOTINE POLACRILEX 2 MG MT LOZG: 2.0000 mg | LOZENGE | OROMUCOSAL | 6 refills | Status: AC | PRN

## 2021-11-18 NOTE — Patient Instructions (Addendum)
[facility] Phone: [phone]  Mammogram appointment 11/26/21 at 11 AM at the [facility] Address: [address], [place], [place] [address]

## 2021-11-18 NOTE — Progress Notes (Signed)
Patient mammogram scheduled for 11/26/21 at 11 AM at the breast center. Patient notified.

## 2021-11-18 NOTE — Progress Notes (Signed)
GYNECOLOGY PROBLEM  VISIT ENCOUNTER NOTE  Subjective:   [name] is a 66 y.o. G2P2002 female here for a problem GYN visit.  Current complaints:     Chief Complaint  Patient presents with   Gynecologic Exam    Reviewed her extensive history in detail.  Denies abnormal vaginal bleeding, discharge, pelvic pain, problems with intercourse or other gynecologic concerns.    Gynecologic History No LMP recorded. Patient has had a hysterectomy.  Contraception: status post hysterectomy  Health Maintenance Due  Topic Date Due   COVID-19 Vaccine (1) Never done   Pneumonia Vaccine 45+ Years old (1 - PCV) Never done   Hepatitis C Screening  Never done   TETANUS/TDAP  Never done   Zoster Vaccines- Shingrix (1 of 2) Never done   MAMMOGRAM  08/16/2016   DEXA SCAN  Never done    The following portions of the patient's history were reviewed and updated as appropriate: allergies, current medications, past family history, past medical history, past social history, past surgical history and problem list.  Review of Systems Pertinent items are noted in HPI.   Objective:  BP (!) 148/70   Pulse 63   Wt 122 lb 6.4 oz (55.5 kg)   BMI 22.39 kg/m  Gen: well appearing, NAD HEENT: no scleral icterus CV: RR Lung: Normal WOB Ext: warm well perfused  PELVIC: Surgically absent labia minora; normal appearing vaginal mucosa and cervix.  No abnormal discharge noted.  Pap smear of  vaginal cuff. Surgically absent uterine size, no other palpable masses, no uterine or adnexal tenderness.   Assessment and Plan:  1. Encounter for screening mammogram for malignant neoplasm of breast - MM 3D SCREEN BREAST BILATERAL; Future  2. Vulvar cancer ([facility]) Normal exam, no concerns  3. Well woman exam with routine gynecological exam - Ambulatory referral to Ophthalmology - nicotine polacrilex (NICOTINE MINI) 2 MG lozenge; Take 1 lozenge (2 mg total) by mouth as needed for smoking cessation.  Dispense: 100  tablet; Refill: 6  4. Hormone replacement therapy (postmenopausal) Stopped estradiol-- high risk of clots with smoking Counseled to work on cessation. See below  5. Encounter for tobacco use cessation counseling Continues to smoke 0.5ppd. Used motivational interviewing to discuss barriers to tobacco cessation. Spent 3-10 minutes in direct counseling about ways to reduce use with the goal of eventual cessation. Reviewed health concerns for pregnancy and infant related to tobacco use.  Discussed gradual reduction and replacement of cigs with other activities.  Patient stated goal: stop  - buPROPion (WELLBUTRIN XL) 150 MG 24 hr tablet; Take 1 tablet (150 mg total) by mouth daily.  Dispense: 90 tablet; Refill: 3 - CT CHEST LUNG CANCER SCREENING LOW DOSE WO CONTRAST; Future  6. Heavy cigarette smoker - CT CHEST LUNG CANCER SCREENING LOW DOSE WO CONTRAST; Future - Ambulatory referral to Ophthalmology - nicotine polacrilex (NICOTINE MINI) 2 MG lozenge; Take 1 lozenge (2 mg total) by mouth as needed for smoking cessation.  Dispense: 100 tablet; Refill: 6  7. Claudication in peripheral vascular disease ([facility]) - Ambulatory referral to Ophthalmology - nicotine polacrilex (NICOTINE MINI) 2 MG lozenge; Take 1 lozenge (2 mg total) by mouth as needed for smoking cessation.  Dispense: 100 tablet; Refill: 6  8. Screening for HPV (human papillomavirus) - Cytology - PAP( [facility])   Please refer to After Visit Summary for other counseling recommendations.   Return in about 1 year (around 11/19/2022).  Caren Macadam, MD, MPH, ABFM Attending Physician Faculty  [facility]

## 2021-11-18 NOTE — Progress Notes (Signed)
Patient has food insecurity but declines food market today.   Paulina Fusi, RN 11/18/21

## 2021-11-20 LAB — CYTOLOGY - PAP
Adequacy: ABSENT
Comment: NEGATIVE
Diagnosis: NEGATIVE
High risk HPV: NEGATIVE

## 2021-11-24 ENCOUNTER — Telehealth: Payer: Self-pay

## 2021-11-24 NOTE — Telephone Encounter (Signed)
-----   Message from Caren Macadam, MD sent at 11/20/2021  5:43 PM EDT ----- NIL, next pap 5 years

## 2021-11-24 NOTE — Telephone Encounter (Signed)
ERROR

## 2021-11-24 NOTE — Telephone Encounter (Signed)
Called Pt to advise of negative Pap Smear results. Pt verbalized understanding.

## 2021-11-26 ENCOUNTER — Ambulatory Visit: Payer: Medicare Other

## 2021-12-31 ENCOUNTER — Other Ambulatory Visit: Payer: Medicare Other

## 2022-01-05 ENCOUNTER — Ambulatory Visit (HOSPITAL_COMMUNITY)
Admission: RE | Admit: 2022-01-05 | Discharge: 2022-01-05 | Disposition: A | Discharge status: Home / Self Care | Payer: Medicare Other | Source: Ambulatory Visit | Attending: Physician Assistant | Admitting: Physician Assistant

## 2022-01-05 DIAGNOSIS — I739 Peripheral vascular disease, unspecified: Secondary | ICD-10-CM | POA: Insufficient documentation

## 2022-01-06 ENCOUNTER — Ambulatory Visit (INDEPENDENT_AMBULATORY_CARE_PROVIDER_SITE_OTHER): Payer: Medicare Other | Admitting: Physician Assistant

## 2022-01-06 ENCOUNTER — Encounter (HOSPITAL_COMMUNITY): Payer: Medicare Other

## 2022-01-06 VITALS — BP 139/81 | HR 72 | Temp 98.1°F | Resp 20 | Wt 120.6 lb

## 2022-01-06 DIAGNOSIS — I739 Peripheral vascular disease, unspecified: Secondary | ICD-10-CM

## 2022-01-06 DIAGNOSIS — F17200 Nicotine dependence, unspecified, uncomplicated: Secondary | ICD-10-CM

## 2022-01-06 NOTE — Progress Notes (Signed)
Office Note     CC:  follow up Requesting Provider:  Cipriano Mile, NP  HPI: [name] is a 66 y.o. (09-Apr-1956) female who presents for evaluation of PAD.  She has known PAD especially right lower extremity with claudication and occasional pain at night that wakes her up.  Pain in her right foot overnight does not occur on a regular basis.  She denies any tissue loss of right foot.  She states the symptoms of her right leg are currently tolerable and does not significantly impact her quality of life.  She had been trying to stop smoking however her mother passed away at 73 years old last month.  She plans to attempt to stop smoking in the near future.  She is on aspirin and statin daily.   Past Medical History:  Diagnosis Date   Adenomatous colon polyp    Angina pectoris (HCC)    Anxiety    Atherosclerosis    Cigarette smoker one half pack a day or less    ~ 10 cigarrettes/day   Complication of anesthesia    Depression    GERD (gastroesophageal reflux disease)    Hyperlipidemia    Osteoarthritis    PONV (postoperative nausea and vomiting)    Prediabetes    PVD (peripheral vascular disease) ([facility])    Vulvar cancer ([facility]) 2009   Radical vulvectomy and left side in July 2009.  Recurrence in 2010-reexcision June 2010.  Last follow-up in 2016.    Past Surgical History:  Procedure Laterality Date   ABDOMINAL HYSTERECTOMY  03/11/2006   For uterine myoma   COLONOSCOPY  08/03/2011   Procedure: COLONOSCOPY;  Surgeon: Wonda Horner, MD;  Location: [facility];  Service: Endoscopy;  Laterality: N/A;   RADICAL VULVECTOMY  12/2007   Laser vulvectomy; repeat procedure in 2010    Social History   Socioeconomic History   Marital status: Divorced    Spouse name: Not on file   Number of children: 2   Years of education: Not on file   Highest education level: Not on file  Occupational History    Employer: UNEMPLOYED   Occupation: disabled  Tobacco Use   Smoking status: Every Day     Packs/day: 0.50    Years: 20.00    Total pack years: 10.00    Types: Cigarettes    Passive exposure: Never   Smokeless tobacco: Never  Vaping Use   Vaping Use: Never used  Substance and Sexual Activity   Alcohol use: No   Drug use: Yes    Types: Marijuana    Comment: Maybe a couple times a month   Sexual activity: Not Currently  Other Topics Concern   Not on file  Social History Narrative   Currently unemployed.   Smokes 10 cigarettes/day   Occasional smokes marijuana-used to help with pain.   Divorced with 2 children 6   Social Determinants of Health   Financial Resource Strain: Not on file  Food Insecurity: Food Insecurity Present (11/18/2021)   Hunger Vital Sign    Worried About Running Out of Food in the Last Year: Sometimes true    Ran Out of Food in the Last Year: Never true  Transportation Needs: No Transportation Needs (11/18/2021)   PRAPARE - Hydrologist (Medical): No    Lack of Transportation (Non-Medical): No  Physical Activity: Not on file  Stress: Not on file  Social Connections: Not on file  Intimate Partner Violence: Not on  file    Family History  Problem Relation Age of Onset   Hypertension Mother    Stroke Mother 26   Heart disease Mother        She is not aware of any details.   Hypertension Sister    Hypertension Sister    Prostate cancer Brother    Hypertension Brother    Hypertension Brother    Hypertension Brother    Colon cancer Neg Hx    Esophageal cancer Neg Hx    Rectal cancer Neg Hx    Stomach cancer Neg Hx     Current Outpatient Medications  Medication Sig Dispense Refill   aspirin 81 MG tablet Take 81 mg by mouth every morning.      atorvastatin (LIPITOR) 40 MG tablet TAKE 1 TABLET(40 MG) BY MOUTH DAILY 90 tablet 3   buPROPion (WELLBUTRIN XL) 150 MG 24 hr tablet Take 1 tablet (150 mg total) by mouth daily. 90 tablet 3   diclofenac sodium (VOLTAREN) 1 % GEL      metoprolol succinate (TOPROL-XL)  25 MG 24 hr tablet TAKE 1 TABLET(25 MG) BY MOUTH DAILY 90 tablet 3   Multiple Vitamins-Minerals (CENTRUM VITAMINTS PO) Take by mouth daily.     nicotine polacrilex (NICOTINE MINI) 2 MG lozenge Take 1 lozenge (2 mg total) by mouth as needed for smoking cessation. 100 tablet 6   omeprazole (PRILOSEC) 20 MG capsule Take 1 capsule (20 mg total) by mouth 2 (two) times daily before a meal. Take twice a day for 4 weeks and then decrease to 1 tablet a day 60 capsule 2   No current facility-administered medications for this visit.    Allergies  Allergen Reactions   Codeine Nausea Only    REACTION: Nausea     REVIEW OF SYSTEMS:   '[X]'$  denotes positive finding, '[ ]'$  denotes negative finding Cardiac  Comments:  Chest pain or chest pressure:    Shortness of breath upon exertion:    Short of breath when lying flat:    Irregular heart rhythm:        Vascular    Pain in calf, thigh, or hip brought on by ambulation:    Pain in feet at night that wakes you up from your sleep:     Blood clot in your veins:    Leg swelling:         Pulmonary    Oxygen at home:    Productive cough:     Wheezing:         Neurologic    Sudden weakness in arms or legs:     Sudden numbness in arms or legs:     Sudden onset of difficulty speaking or slurred speech:    Temporary loss of vision in one eye:     Problems with dizziness:         Gastrointestinal    Blood in stool:     Vomited blood:         Genitourinary    Burning when urinating:     Blood in urine:        Psychiatric    Major depression:         Hematologic    Bleeding problems:    Problems with blood clotting too easily:        Skin    Rashes or ulcers:        Constitutional    Fever or chills:      PHYSICAL EXAMINATION:  Vitals:  01/06/22 1341  BP: 139/81  Pulse: 72  Resp: 20  Temp: 98.1 F (36.7 C)  TempSrc: Temporal  SpO2: 100%  Weight: 120 lb 9.6 oz (54.7 kg)    General:  WDWN in NAD; vital signs documented  above Gait: Not observed HENT: WNL, normocephalic Pulmonary: normal non-labored breathing , without Rales, rhonchi,  wheezing Cardiac: regular HR Abdomen: soft, NT, no masses Skin: without rashes Vascular Exam/Pulses:  Right Left  DP absent 2+ (normal)  PT absent absent   Extremities: without ischemic changes, without Gangrene , without cellulitis; without open wounds;  Musculoskeletal: no muscle wasting or atrophy  Neurologic: A&O X 3;  No focal weakness or paresthesias are detected Psychiatric:  The pt has Normal affect.   Non-Invasive Vascular Imaging:   ABI/TBIToday's ABIToday's TBIPrevious ABIPrevious TBI  +-------+-----------+-----------+------------+------------+  Right  0.47       0.29       0.46        0.27          +-------+-----------+-----------+------------+------------+  Left   0.85       0.77       0.81        0.71          +-------+-----------+-----------+------------+------------+    ASSESSMENT/PLAN:: 66 y.o. female for surveillance of PAD   -Patient continues to have claudication symptoms and occasional pain in her right foot at night that wakes her up.  This does not occur on a regular basis.  She also is without tissue loss.  She believes her symptoms are currently tolerable.  We discussed peripheral arterial disease and the results of her studies indicating that she has moderate to severe arterial disease of her right lower extremity.  If pain at night were to become more regular or if she developed wounds on her right foot she would need angiography.  She would like to hold off for now.  We also discussed the importance of smoking cessation and the patient plans to focus on this.  She will follow-up with repeat ABIs in 6 months.  She will call/return office sooner with any questions or concerns.   Dagoberto Ligas, PA-C [facility] [phone]  Clinic MD:   Donzetta Matters

## 2022-01-08 ENCOUNTER — Other Ambulatory Visit: Payer: Self-pay

## 2022-01-08 DIAGNOSIS — I739 Peripheral vascular disease, unspecified: Secondary | ICD-10-CM

## 2022-01-19 ENCOUNTER — Other Ambulatory Visit: Payer: Self-pay | Admitting: Gastroenterology

## 2022-03-08 ENCOUNTER — Ambulatory Visit: Payer: Medicare Other | Attending: Cardiology | Admitting: Cardiology

## 2022-03-25 ENCOUNTER — Other Ambulatory Visit: Payer: Self-pay | Admitting: Student

## 2022-03-25 DIAGNOSIS — F17210 Nicotine dependence, cigarettes, uncomplicated: Secondary | ICD-10-CM

## 2022-09-15 ENCOUNTER — Ambulatory Visit (INDEPENDENT_AMBULATORY_CARE_PROVIDER_SITE_OTHER): Payer: 59 | Admitting: Physician Assistant

## 2022-09-15 ENCOUNTER — Ambulatory Visit (HOSPITAL_COMMUNITY)
Admission: RE | Admit: 2022-09-15 | Discharge: 2022-09-15 | Disposition: A | Discharge status: Home / Self Care | Payer: 59 | Source: Ambulatory Visit | Attending: Vascular Surgery | Admitting: Vascular Surgery

## 2022-09-15 VITALS — BP 147/79 | HR 64 | Temp 98.0°F | Wt 120.0 lb

## 2022-09-15 DIAGNOSIS — F17200 Nicotine dependence, unspecified, uncomplicated: Secondary | ICD-10-CM

## 2022-09-15 DIAGNOSIS — I739 Peripheral vascular disease, unspecified: Secondary | ICD-10-CM | POA: Diagnosis present

## 2022-09-15 LAB — VAS US ABI WITH/WO TBI
Left ABI: 0.78
Right ABI: 0.54

## 2022-09-15 NOTE — Progress Notes (Signed)
Office Note     CC:  follow up Requesting Provider:  Cipriano Mile, NP  HPI: [name] is a 67 y.o. (25-Jan-1956) female who presents for follow up of PAD. She has had bilateral claudication symptoms for a couple years now especially in the right lower extremity. This has not been lifestyle limiting. She has been without any true rest pain and no tissue loss.   Today she returns for follow up with non invasive studies. She explains that her symptoms are essentially unchanged. Every day her pain is different. Some days are minimal and tolerable, some days very painful. She describes most of her pain in right thigh and right foot. This is worse at rest usually but she says does occur on ambulation as well. Sharp pains and aching at times. Pain occasionally will wake her up from sleep. This is not nightly. If she has pain she will get up and walk around, use heating pad or elevate with some resolution. She feels overall she is on most days able to do what activities she wants.  She is compliant with her Aspirin and statin. She is still smoking about 1/2 ppd.   The pt is on a statin for cholesterol management.  The pt is  on a daily aspirin.   Other AC:  none The pt is on BB for hypertension.   The pt is not diabetic. Tobacco hx:  current, 1/2 ppd  Past Medical History:  Diagnosis Date   Adenomatous colon polyp    Angina pectoris ([place])    Anxiety    Atherosclerosis    Cigarette smoker one half pack a day or less    ~ 10 cigarrettes/day   Complication of anesthesia    Depression    GERD (gastroesophageal reflux disease)    Hyperlipidemia    Osteoarthritis    PONV (postoperative nausea and vomiting)    Prediabetes    PVD (peripheral vascular disease) ([place])    Vulvar cancer ([place]) 2009   Radical vulvectomy and left side in July 2009.  Recurrence in 2010-reexcision June 2010.  Last follow-up in 2016.    Past Surgical History:  Procedure Laterality Date   ABDOMINAL HYSTERECTOMY   03/11/2006   For uterine myoma   COLONOSCOPY  08/03/2011   Procedure: COLONOSCOPY;  Surgeon: Wonda Horner, MD;  Location: [facility];  Service: Endoscopy;  Laterality: N/A;   RADICAL VULVECTOMY  12/2007   Laser vulvectomy; repeat procedure in 2010    Social History   Socioeconomic History   Marital status: Divorced    Spouse name: Not on file   Number of children: 2   Years of education: Not on file   Highest education level: Not on file  Occupational History    Employer: UNEMPLOYED   Occupation: disabled  Tobacco Use   Smoking status: Every Day    Packs/day: 0.50    Years: 20.00    Additional pack years: 0.00    Total pack years: 10.00    Types: Cigarettes    Passive exposure: Never   Smokeless tobacco: Never  Vaping Use   Vaping Use: Never used  Substance and Sexual Activity   Alcohol use: No   Drug use: Yes    Types: Marijuana    Comment: Maybe a couple times a month   Sexual activity: Not Currently  Other Topics Concern   Not on file  Social History Narrative   Currently unemployed.   Smokes 10 cigarettes/day   Occasional smokes marijuana-used  to help with pain.   Divorced with 2 children 6   Social Determinants of Health   Financial Resource Strain: Not on file  Food Insecurity: Food Insecurity Present (11/18/2021)   Hunger Vital Sign    Worried About Running Out of Food in the Last Year: Sometimes true    Ran Out of Food in the Last Year: Never true  Transportation Needs: No Transportation Needs (11/18/2021)   PRAPARE - Hydrologist (Medical): No    Lack of Transportation (Non-Medical): No  Physical Activity: Not on file  Stress: Not on file  Social Connections: Not on file  Intimate Partner Violence: Not on file    Family History  Problem Relation Age of Onset   Hypertension Mother    Stroke Mother 73   Heart disease Mother        She is not aware of any details.   Hypertension Sister    Hypertension Sister     Prostate cancer Brother    Hypertension Brother    Hypertension Brother    Hypertension Brother    Colon cancer Neg Hx    Esophageal cancer Neg Hx    Rectal cancer Neg Hx    Stomach cancer Neg Hx     Current Outpatient Medications  Medication Sig Dispense Refill   aspirin 81 MG tablet Take 81 mg by mouth every morning.      atorvastatin (LIPITOR) 40 MG tablet TAKE 1 TABLET(40 MG) BY MOUTH DAILY 90 tablet 3   buPROPion (WELLBUTRIN XL) 150 MG 24 hr tablet Take 1 tablet (150 mg total) by mouth daily. 90 tablet 3   diclofenac sodium (VOLTAREN) 1 % GEL      metoprolol succinate (TOPROL-XL) 25 MG 24 hr tablet TAKE 1 TABLET(25 MG) BY MOUTH DAILY 90 tablet 3   Multiple Vitamins-Minerals (CENTRUM VITAMINTS PO) Take by mouth daily.     nicotine polacrilex (NICOTINE MINI) 2 MG lozenge Take 1 lozenge (2 mg total) by mouth as needed for smoking cessation. 100 tablet 6   omeprazole (PRILOSEC) 20 MG capsule TAKE ONE CAPSULE BY MOUTH TWICE DAILY 60 capsule 2   No current facility-administered medications for this visit.    Allergies  Allergen Reactions   Codeine Nausea Only    REACTION: Nausea     REVIEW OF SYSTEMS:   [X]  denotes positive finding, [ ]  denotes negative finding Cardiac  Comments:  Chest pain or chest pressure:    Shortness of breath upon exertion:    Short of breath when lying flat:    Irregular heart rhythm:        Vascular    Pain in calf, thigh, or hip brought on by ambulation:    Pain in feet at night that wakes you up from your sleep:     Blood clot in your veins:    Leg swelling:         Pulmonary    Oxygen at home:    Productive cough:     Wheezing:         Neurologic    Sudden weakness in arms or legs:     Sudden numbness in arms or legs:     Sudden onset of difficulty speaking or slurred speech:    Temporary loss of vision in one eye:     Problems with dizziness:         Gastrointestinal    Blood in stool:     Vomited blood:  Genitourinary     Burning when urinating:     Blood in urine:        Psychiatric    Major depression:         Hematologic    Bleeding problems:    Problems with blood clotting too easily:        Skin    Rashes or ulcers:        Constitutional    Fever or chills:      PHYSICAL EXAMINATION:  Vitals:   09/15/22 1218  BP: (!) 147/79  Pulse: 64  Temp: 98 F (36.7 C)  TempSrc: Temporal  SpO2: 99%  Weight: 120 lb (54.4 kg)    General:  WDWN in NAD; vital signs documented above Gait: Normal HENT: WNL, normocephalic Pulmonary: normal non-labored breathing , without wheezing Cardiac: regular HR, without  Murmurs without carotid bruit Abdomen: soft Vascular Exam/Pulses: feet warm and well perfused  Right Left  Radial 2+ (normal) 2+ (normal)  Femoral 2+ (normal) 2+ (normal)  Popliteal Not palpable Not palpable  DP absent absent  PT absent absent   Extremities: without ischemic changes, without Gangrene , without cellulitis; without open wounds;  Musculoskeletal: no muscle wasting or atrophy  Neurologic: A&O X 3;  No focal weakness or paresthesias are detected Psychiatric:  The pt has Normal affect.   Non-Invasive Vascular Imaging:   +-------+-----------+-----------+------------+------------+  ABI/TBIToday's ABIToday's TBIPrevious ABIPrevious TBI  +-------+-----------+-----------+------------+------------+  Right 0.54       0.34       0.47        0.29          +-------+-----------+-----------+------------+------------+  Left  0.78       0.57       0.85        0.77          +-------+-----------+-----------+------------+------------+    ASSESSMENT/PLAN:: 67 y.o. female here for follow up for PAD. She continues to have claudication and rest pain like symptoms. Her symptoms do not sound entirely arterial in nature I think likely they are multifactorial. At this time her symptoms are not lifestyle limiting and she does not have any tissue loss. Her ABIs today are  essentially unchanged. Slight decrease in LLE.  - Continue Aspirin and statin - Encourage walking/ exercise regimen - encourage smoking cessation - she will follow up in 6 months with ABI - She knows to call for earlier follow up if she has new or worsening symptoms   Karoline Caldwell, PA-C [facility] [phone]  Clinic MD:   [facility]

## 2022-09-17 ENCOUNTER — Other Ambulatory Visit: Payer: Self-pay

## 2022-09-17 DIAGNOSIS — I739 Peripheral vascular disease, unspecified: Secondary | ICD-10-CM

## 2022-10-08 NOTE — Progress Notes (Deleted)
Office Visit Note  Patient: [name]             Date of Birth: 07/26/1955           MRN: [id number]             PCP: Hillery Aldo, NP Referring: Hillery Aldo, NP Visit Date: 10/22/2022 Occupation: @  Subjective:  No chief complaint on file.   History of Present Illness: [name] is a 67 y.o. female ***     Activities of Daily Living:  Patient reports morning stiffness for *** {minute/hour:19697}.   Patient {ACTIONS;DENIES/REPORTS:21021675::"Denies"} nocturnal pain.  Difficulty dressing/grooming: {ACTIONS;DENIES/REPORTS:21021675::"Denies"} Difficulty climbing stairs: {ACTIONS;DENIES/REPORTS:21021675::"Denies"} Difficulty getting out of chair: {ACTIONS;DENIES/REPORTS:21021675::"Denies"} Difficulty using hands for taps, buttons, cutlery, and/or writing: {ACTIONS;DENIES/REPORTS:21021675::"Denies"}  No Rheumatology ROS completed.   PMFS History:  Patient Active Problem List   Diagnosis Date Noted   Hormone replacement therapy (postmenopausal) 11/18/2021   Hyperlipidemia with target LDL less than 70 08/27/2020   Claudication in peripheral vascular disease 08/27/2020   Palpitations 08/27/2020   Abnormal fourth heart sound (S4) 08/27/2020   DOE (dyspnea on exertion) 08/27/2020   Skin lesion of right leg 07/18/2014   Callus of heel 07/18/2014   LOW BACK PAIN SYNDROME 02/23/2010   URINARY INCONTINENCE, MIXED 02/23/2010   CERVICAL RADICULOPATHY, RIGHT 09/12/2009   SHOULDER PAIN, BILATERAL 05/28/2009   NECK PAIN 05/28/2009   Pain in limb 08/16/2008   KNEE PAIN, BILATERAL 03/12/2008   ANKLE PAIN, RIGHT 03/12/2008   THYROMEGALY 11/14/2007   TOBACCO ABUSE 11/07/2007   Exertional chest pain 11/07/2007   CARCINOMA, SKIN, 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  Recurrence in 2010-reexcision June 2010.  Last follow-up in 2016.    Family History  Problem Relation Age of Onset   Hypertension Mother    Stroke Mother 22   Heart disease Mother        She is not aware of any details.   Hypertension Sister    Hypertension Sister    Prostate cancer Brother    Hypertension Brother    Hypertension Brother    Hypertension Brother    Colon cancer Neg Hx    Esophageal cancer Neg Hx    Rectal cancer Neg Hx    Stomach cancer Neg Hx    Past Surgical History:  Procedure Laterality Date   ABDOMINAL HYSTERECTOMY  03/11/2006   For uterine myoma   COLONOSCOPY  08/03/2011   Procedure: COLONOSCOPY;  Surgeon: Graylin Shiver, MD;  Location: [facility];  Service: Endoscopy;  Laterality: N/A;   RADICAL VULVECTOMY  12/2007   Laser vulvectomy; repeat procedure in 2010   Social History   Social History Narrative   Currently unemployed.   Smokes 10 cigarettes/day   Occasional smokes marijuana-used to help with pain.   Divorced with 2 children 6  Immunization History  Administered Date(s) Administered   Influenza Whole 05/28/2009     Objective: Vital Signs: There were no vitals taken for this visit.   Physical Exam   Musculoskeletal Exam: ***  CDAI Exam: CDAI Score: -- Patient Global: --; Provider Global: -- Swollen: --; Tender: -- Joint Exam 10/22/2022   No joint exam has been documented for this visit   There is currently no information documented on the homunculus. Go to the Rheumatology activity and complete the homunculus joint exam.  Investigation: No additional findings.  Imaging: VAS  [place] ABI WITH/WO TBI  Result Date: 09/15/2022  LOWER EXTREMITY DOPPLER STUDY Patient Name:  [name]  Date of Exam:   09/15/2022 Medical Rec #: [id number]       Accession #:    [id number] Date of Birth: 1955/10/01       Patient Gender: F Patient Age:   20 years Exam Location:  [facility] Procedure:      VAS [place] ABI WITH/WO TBI Referring Phys: Lemar Livings --------------------------------------------------------------------------------  Indications: Claudication, and peripheral artery disease. High Risk Factors: Hyperlipidemia, current smoker.  Comparison Study: 01/05/22 ABI                   10/06/20 B/L LE arterial duplex Performing Technologist: Lowell Guitar RVT, RDMS  Examination Guidelines: A complete evaluation includes at minimum, Doppler waveform signals and systolic blood pressure reading at the level of bilateral brachial, anterior tibial, and posterior tibial arteries, when vessel segments are accessible. Bilateral testing is considered an integral part of a complete examination. Photoelectric Plethysmograph (PPG) waveforms and toe systolic pressure readings are included as required and additional duplex testing as needed. Limited examinations for reoccurring indications may be performed as noted.  ABI Findings: +---------+------------------+-----+----------+--------+ Right    Rt Pressure (mmHg)IndexWaveform  Comment  +---------+------------------+-----+----------+--------+ Brachial 142                                       +---------+------------------+-----+----------+--------+ PTA      80                0.54 monophasic         +---------+------------------+-----+----------+--------+ DP       72                0.49 monophasic         +---------+------------------+-----+----------+--------+ Great Toe50                0.34 Abnormal           +---------+------------------+-----+----------+--------+ +---------+------------------+-----+----------+-------+ Left      Lt Pressure (mmHg)IndexWaveform  Comment +---------+------------------+-----+----------+-------+ Brachial 148                                      +---------+------------------+-----+----------+-------+ PTA      112               0.76 monophasic        +---------+------------------+-----+----------+-------+ DP       116               0.78 monophasic        +---------+------------------+-----+----------+-------+ Great Toe84                0.57 Normal            +---------+------------------+-----+----------+-------+ +-------+-----------+-----------+------------+------------+  ABI/TBIToday's ABIToday's TBIPrevious ABIPrevious TBI +-------+-----------+-----------+------------+------------+ Right  0.54       0.34       0.47        0.29         +-------+-----------+-----------+------------+------------+ Left   0.78       0.57       0.85        0.77         +-------+-----------+-----------+------------+------------+  Arterial wall calcification precludes accurate ankle pressures and ABIs. PPG tracings display appropriate pulsatility. Right ABIs and TBIs appear increased. Left ABIs and TBIs appear decreased.  Summary: Right: Resting right ankle-brachial index indicates moderate right lower extremity arterial disease. The right toe-brachial index is abnormal. Left: Resting left ankle-brachial index indicates moderate left lower extremity arterial disease. The left toe-brachial index is abnormal. *See table(s) above for measurements and observations.  Electronically signed by Lemar Livings MD on 09/15/2022 at 2:02:30 PM.    Final     Recent Labs: Lab Results  Component Value Date   WBC 7.1 10/12/2021   HGB 13.3 10/12/2021   PLT 290.0 10/12/2021   NA 142 09/10/2020   K 4.4 09/10/2020   CL 103 09/10/2020   CO2 23 09/10/2020   GLUCOSE 83 09/10/2020   BUN 7 (L) 09/10/2020   CREATININE 0.81 09/10/2020   BILITOT 0.5 02/23/2010   ALKPHOS 75 02/23/2010   AST 13  02/23/2010   ALT <8 U/L 02/23/2010   PROT 7.4 02/23/2010   ALBUMIN 4.3 02/23/2010   CALCIUM 9.7 09/10/2020   GFRAA  11/29/2008    >60        The eGFR has been calculated using the MDRD equation. This calculation has not been validated in all clinical situations. eGFR's persistently <60 mL/min signify possible Chronic Kidney Disease.   March 26, 2022 LDL 81, magnesium normal, CMP normal, CBC normal, ESR 31, ANA 1: 40 NS, RF negative, C-reactive protein 13.1 Nov 04, 2021 iron studies normal, LDL 122, PTH normal, calcium normal, vitamin D 33, TSH normal  Speciality Comments: No specialty comments available.  Procedures:  No procedures performed Allergies: Codeine   Assessment / Plan:     Visit Diagnoses: Polyarthralgia - Pain bilateral shoulders, bilateral knees and right ankle.  Positive ANA (antinuclear antibody)  DDD (degenerative disc disease), cervical  Chronic midline low back pain without sciatica  Vulvar cancer  Angina pectoris  Palpitations  Claudication in peripheral vascular disease  Hyperlipidemia with target LDL less than 70  Hormone replacement therapy (postmenopausal)  TOBACCO ABUSE  Orders: No orders of the defined types were placed in this encounter.  No orders of the defined types were placed in this encounter.   Face-to-face time spent with patient was *** minutes. Greater than 50% of time was spent in counseling and coordination of care.  Follow-Up Instructions: No follow-ups on file.   Pollyann Savoy, MD  Note - This record has been created using Animal nutritionist.  Chart creation errors have been sought, but may not always  have been located. Such creation errors do not reflect on  the standard of medical care.

## 2022-10-22 ENCOUNTER — Encounter: Payer: Medicare Other | Admitting: Rheumatology

## 2022-10-22 DIAGNOSIS — M5030 Other cervical disc degeneration, unspecified cervical region: Secondary | ICD-10-CM

## 2022-10-22 DIAGNOSIS — M545 Low back pain, unspecified: Secondary | ICD-10-CM

## 2022-10-22 DIAGNOSIS — M2550 Pain in unspecified joint: Secondary | ICD-10-CM

## 2022-10-22 DIAGNOSIS — C519 Malignant neoplasm of vulva, unspecified: Secondary | ICD-10-CM

## 2022-10-22 DIAGNOSIS — R768 Other specified abnormal immunological findings in serum: Secondary | ICD-10-CM

## 2022-10-22 DIAGNOSIS — I209 Angina pectoris, unspecified: Secondary | ICD-10-CM

## 2022-10-22 DIAGNOSIS — F17200 Nicotine dependence, unspecified, uncomplicated: Secondary | ICD-10-CM

## 2022-10-22 DIAGNOSIS — I739 Peripheral vascular disease, unspecified: Secondary | ICD-10-CM

## 2022-10-22 DIAGNOSIS — Z79890 Hormone replacement therapy (postmenopausal): Secondary | ICD-10-CM

## 2022-10-22 DIAGNOSIS — R002 Palpitations: Secondary | ICD-10-CM

## 2022-10-22 DIAGNOSIS — E785 Hyperlipidemia, unspecified: Secondary | ICD-10-CM

## 2022-11-15 ENCOUNTER — Ambulatory Visit (INDEPENDENT_AMBULATORY_CARE_PROVIDER_SITE_OTHER): Payer: 59 | Admitting: Podiatry

## 2022-11-15 DIAGNOSIS — L84 Corns and callosities: Secondary | ICD-10-CM | POA: Diagnosis not present

## 2022-11-15 DIAGNOSIS — M79674 Pain in right toe(s): Secondary | ICD-10-CM

## 2022-11-15 DIAGNOSIS — B351 Tinea unguium: Secondary | ICD-10-CM | POA: Diagnosis not present

## 2022-11-15 DIAGNOSIS — I739 Peripheral vascular disease, unspecified: Secondary | ICD-10-CM | POA: Diagnosis not present

## 2022-11-15 DIAGNOSIS — M79675 Pain in left toe(s): Secondary | ICD-10-CM | POA: Diagnosis not present

## 2022-11-15 NOTE — Progress Notes (Signed)
Subjective:   Patient ID: [name], female   DOB: 67 y.o.   MRN: [id number]   HPI Chief Complaint  Patient presents with   Callouses   67 year old female presents With Concern for Infected Toenails of Suspect Calluses to Both of Her Feet Are Causing Discomfort.  She Occasionally Will Soak Her Feet and She Also Tries to Follow with Dr. Seymour Bars.  She Is Try Different Creams As Well.  No Open Lesions He Reports No Drainage or Bleeding.  She Also Follows with Vascular Surgery for PAD.   Review of Systems  All other systems reviewed and are negative.  Past Medical History:  Diagnosis Date   Adenomatous colon polyp    Angina pectoris ([facility])    Anxiety    Atherosclerosis    Cigarette smoker one half pack a day or less    ~ 10 cigarrettes/day   Complication of anesthesia    Depression    GERD (gastroesophageal reflux disease)    Hyperlipidemia    Osteoarthritis    PONV (postoperative nausea and vomiting)    Prediabetes    PVD (peripheral vascular disease) ([facility])    Vulvar cancer ([facility]) 2009   Radical vulvectomy and left side in July 2009.  Recurrence in 2010-reexcision June 2010.  Last follow-up in 2016.    Past Surgical History:  Procedure Laterality Date   ABDOMINAL HYSTERECTOMY  03/11/2006   For uterine myoma   COLONOSCOPY  08/03/2011   Procedure: COLONOSCOPY;  Surgeon: Graylin Shiver, MD;  Location: [facility];  Service: Endoscopy;  Laterality: N/A;   RADICAL VULVECTOMY  12/2007   Laser vulvectomy; repeat procedure in 2010     Current Outpatient Medications:    aspirin 81 MG tablet, Take 81 mg by mouth every morning. , Disp: , Rfl:    atorvastatin (LIPITOR) 40 MG tablet, TAKE 1 TABLET(40 MG) BY MOUTH DAILY, Disp: 90 tablet, Rfl: 3   buPROPion (WELLBUTRIN XL) 150 MG 24 hr tablet, Take 1 tablet (150 mg total) by mouth daily., Disp: 90 tablet, Rfl: 3   diclofenac sodium (VOLTAREN) 1 % GEL, , Disp: , Rfl:    metoprolol succinate (TOPROL-XL) 25 MG 24 hr tablet, TAKE 1  TABLET(25 MG) BY MOUTH DAILY, Disp: 90 tablet, Rfl: 3   Multiple Vitamins-Minerals (CENTRUM VITAMINTS PO), Take by mouth daily., Disp: , Rfl:    nicotine polacrilex (NICOTINE MINI) 2 MG lozenge, Take 1 lozenge (2 mg total) by mouth as needed for smoking cessation., Disp: 100 tablet, Rfl: 6   omeprazole (PRILOSEC) 20 MG capsule, TAKE ONE CAPSULE BY MOUTH TWICE DAILY, Disp: 60 capsule, Rfl: 2  Allergies  Allergen Reactions   Codeine Nausea Only    REACTION: Nausea           Objective:  Physical Exam  General: AAO x3, NAD  Dermatological: Nails are hypertrophic, dystrophic, brittle, discolored, elongated 10. No surrounding redness or drainage.  Mild incurvation of nail borders without side adduction.  Tenderness nails 1-5 bilaterally.  Thick hyperkeratotic lesion left foot submetatarsal 2, medial heel, right submetatarsal 5, posterior heel without any underlying ulceration drainage or signs of infection.  There are no open lesions.  Vascular: Dorsalis Pedis artery and Posterior Tibial artery pedal pulses are decreased bilateral with immedate capillary fill time. There is no pain with calf compression, swelling, warmth, erythema.   Neruologic: Sensation intact with Phoebe Perch monofilament.  Musculoskeletal: Hammertoes present.  Gait: Unassisted, Nonantalgic.     Assessment:   67 year old female with symptomatic onychomycosis,  preulcerative calluses, PAD     Plan:  -Treatment options discussed including all alternatives, risks, and complications -Etiology of symptoms were discussed -Nails sharply debrided x 10 without any complications or bleeding.  Patient was asked what her nails curl.  Recommended routine debridement of the nails and would recommend holding off on nail avulsion given PAD.  Lambert Mody debrided hyperkeratotic lesions x 4 without any complications or bleeding.  Discussed moisturizer, offloading. -On the way out the patient states that his feet were little  sensitive.  There is no skin breakdown or signs of infection or bleeding today during the debridement.  This is likely from where the calluses are very thick and has been trimmed.  Recommend moisturizer daily.   -Daily foot inspection given PAD.  Return in about 3 months (around 02/15/2023) for nail and callus care; PAD.  Vivi Barrack DPM

## 2022-11-16 ENCOUNTER — Ambulatory Visit: Payer: Medicare Other | Admitting: Rheumatology

## 2022-11-17 ENCOUNTER — Other Ambulatory Visit: Payer: Self-pay | Admitting: Cardiology

## 2023-02-14 ENCOUNTER — Ambulatory Visit (HOSPITAL_BASED_OUTPATIENT_CLINIC_OR_DEPARTMENT_OTHER): Payer: Medicare Other | Admitting: Orthopaedic Surgery

## 2023-02-15 ENCOUNTER — Ambulatory Visit (INDEPENDENT_AMBULATORY_CARE_PROVIDER_SITE_OTHER): Payer: 59 | Admitting: Podiatry

## 2023-02-15 DIAGNOSIS — M79675 Pain in left toe(s): Secondary | ICD-10-CM | POA: Diagnosis not present

## 2023-02-15 DIAGNOSIS — B351 Tinea unguium: Secondary | ICD-10-CM | POA: Diagnosis not present

## 2023-02-15 DIAGNOSIS — M79674 Pain in right toe(s): Secondary | ICD-10-CM

## 2023-02-15 DIAGNOSIS — L84 Corns and callosities: Secondary | ICD-10-CM | POA: Diagnosis not present

## 2023-02-15 NOTE — Progress Notes (Addendum)
This patient returns to my office for at risk foot care.  This patient requires this care by a professional since this patient will be at risk due to having PVD and claudication.  This patient is unable to cut nails himself since the patient cannot reach his nails.These nails are painful walking and wearing shoes. She also has painful callus left forefoot. This patient presents for at risk foot care today.  General Appearance  Alert, conversant and in no acute stress.  Vascular  Dorsalis pedis and posterior tibial  pulses are palpable  bilaterally.  Capillary return is within normal limits  bilaterally. Temperature is within normal limits  bilaterally.  Neurologic  Senn-Weinstein monofilament wire test within normal limits  bilaterally. Muscle power within normal limits bilaterally.  Nails Thick disfigured discolored nails with subungual debris  from hallux to fifth toes bilaterally. No evidence of bacterial infection or drainage bilaterally.  Orthopedic  No limitations of motion  feet .  No crepitus or effusions noted.  No bony pathology or digital deformities noted.  Skin  normotropic skin with  noted bilaterally.  No signs of infections or ulcers noted.   Porokeratosis sub 4 left foot.  Onychomycosis  Pain in right toes  Pain in left toes  Porokeratosis left foot.  Consent was obtained for treatment procedures.   Mechanical debridement of nails 1-5  bilaterally performed with a nail nipper.  Filed with dremel without incident. Debride porokeratosis left foot with # 15 blade.  Debride heel callus with # 15 blade and dremel tool.   Return office visit   3 months                   Told patient to return for periodic foot care and evaluation due to potential at risk complications.   Helane Gunther DPM

## 2023-03-11 ENCOUNTER — Other Ambulatory Visit (HOSPITAL_BASED_OUTPATIENT_CLINIC_OR_DEPARTMENT_OTHER): Payer: Self-pay | Admitting: Student

## 2023-03-11 DIAGNOSIS — F17210 Nicotine dependence, cigarettes, uncomplicated: Secondary | ICD-10-CM

## 2023-03-15 NOTE — Progress Notes (Deleted)
HISTORY AND PHYSICAL     CC:  follow up. Requesting Provider:  Hillery Aldo, NP  HPI: This is a 67 y.o. female who is here today for follow up for PAD.  Pt has hx of bilateral claudication symptoms for a couple years now especially in the right lower extremity.   Pt was last seen 09/15/2022 and at that time, she continued to have some claudication but it was not lifestyle limiting and she did not have any rest pain or tissue loss.  Her pain was different every day.  Some days was minimal and others were painful.  Most of her pain was in the right thigh and right foot and was worse at rest but would sometimes occur with ambulation.  She would have some sharp pains and aching at times.  Pain would occasionally wake her at night and she would get up and walk around, use a heating pad or elevate with some resolution.  She felt she was able to do her activities overall.    The pt returns today for follow up.  ***  The pt is on a statin for cholesterol management.    The pt is on an aspirin.    Other AC:  none The pt is on BB for hypertension.  The pt is not on medication for diabetes. Tobacco hx:  current  Pt does *** have family hx of AAA.  Past Medical History:  Diagnosis Date   Adenomatous colon polyp    Angina pectoris (HCC)    Anxiety    Atherosclerosis    Cigarette smoker one half pack a day or less    ~ 10 cigarrettes/day   Complication of anesthesia    Depression    GERD (gastroesophageal reflux disease)    Hyperlipidemia    Osteoarthritis    PONV (postoperative nausea and vomiting)    Prediabetes    PVD (peripheral vascular disease) (HCC)    Vulvar cancer (HCC) 2009   Radical vulvectomy and left side in July 2009.  Recurrence in 2010-reexcision June 2010.  Last follow-up in 2016.    Past Surgical History:  Procedure Laterality Date   ABDOMINAL HYSTERECTOMY  03/11/2006   For uterine myoma   COLONOSCOPY  08/03/2011   Procedure: COLONOSCOPY;  Surgeon: Graylin Shiver, MD;   Location: [facility];  Service: Endoscopy;  Laterality: N/A;   RADICAL VULVECTOMY  12/2007   Laser vulvectomy; repeat procedure in 2010    Allergies  Allergen Reactions   Codeine Nausea Only    REACTION: Nausea    Current Outpatient Medications  Medication Sig Dispense Refill   aspirin 81 MG tablet Take 81 mg by mouth every morning.      atorvastatin (LIPITOR) 40 MG tablet TAKE 1 TABLET(40 MG) BY MOUTH DAILY 90 tablet 0   buPROPion (WELLBUTRIN XL) 150 MG 24 hr tablet Take 1 tablet (150 mg total) by mouth daily. 90 tablet 3   diclofenac sodium (VOLTAREN) 1 % GEL      metoprolol succinate (TOPROL-XL) 25 MG 24 hr tablet TAKE 1 TABLET(25 MG) BY MOUTH DAILY 90 tablet 0   Multiple Vitamins-Minerals (CENTRUM VITAMINTS PO) Take by mouth daily.     nicotine polacrilex (NICOTINE MINI) 2 MG lozenge Take 1 lozenge (2 mg total) by mouth as needed for smoking cessation. 100 tablet 6   omeprazole (PRILOSEC) 20 MG capsule TAKE ONE CAPSULE BY MOUTH TWICE DAILY 60 capsule 2   No current facility-administered medications for this visit.    Family  History  Problem Relation Age of Onset   Hypertension Mother    Stroke Mother 25   Heart disease Mother        She is not aware of any details.   Hypertension Sister    Hypertension Sister    Prostate cancer Brother    Hypertension Brother    Hypertension Brother    Hypertension Brother    Colon cancer Neg Hx    Esophageal cancer Neg Hx    Rectal cancer Neg Hx    Stomach cancer Neg Hx     Social History   Socioeconomic History   Marital status: Divorced    Spouse name: Not on file   Number of children: 2   Years of education: Not on file   Highest education level: Not on file  Occupational History    Employer: UNEMPLOYED   Occupation: disabled  Tobacco Use   Smoking status: Every Day    Current packs/day: 0.50    Average packs/day: 0.5 packs/day for 20.0 years (10.0 ttl pk-yrs)    Types: Cigarettes    Passive exposure: Never    Smokeless tobacco: Never  Vaping Use   Vaping status: Never Used  Substance and Sexual Activity   Alcohol use: No   Drug use: Yes    Types: Marijuana    Comment: Maybe a couple times a month   Sexual activity: Not Currently  Other Topics Concern   Not on file  Social History Narrative   Currently unemployed.   Smokes 10 cigarettes/day   Occasional smokes marijuana-used to help with pain.   Divorced with 2 children 6   Social Determinants of Health   Financial Resource Strain: Not on file  Food Insecurity: Food Insecurity Present (11/18/2021)   Hunger Vital Sign    Worried About Running Out of Food in the Last Year: Sometimes true    Ran Out of Food in the Last Year: Never true  Transportation Needs: No Transportation Needs (11/18/2021)   PRAPARE - Administrator, Civil Service (Medical): No    Lack of Transportation (Non-Medical): No  Physical Activity: Not on file  Stress: Not on file  Social Connections: Unknown (01/31/2023)   Received [facility]   Social Network    Social Network: Not on file  Intimate Partner Violence: Unknown (01/31/2023)   Received from [facility]   HITS    Physically Hurt: Not on file    Insult or Talk Down To: Not on file    Threaten Physical Harm: Not on file    Scream or Curse: Not on file     REVIEW OF SYSTEMS:  *** [X]  denotes positive finding, [ ]  denotes negative finding Cardiac  Comments:  Chest pain or chest pressure:    Shortness of breath upon exertion:    Short of breath when lying flat:    Irregular heart rhythm:        Vascular    Pain in calf, thigh, or hip brought on by ambulation:    Pain in feet at night that wakes you up from your sleep:     Blood clot in your veins:    Leg swelling:         Pulmonary    Oxygen at home:    Productive cough:     Wheezing:         Neurologic    Sudden weakness in arms or legs:     Sudden numbness in arms or legs:  Sudden onset of difficulty speaking or  slurred speech:    Temporary loss of vision in one eye:     Problems with dizziness:         Gastrointestinal    Blood in stool:     Vomited blood:         Genitourinary    Burning when urinating:     Blood in urine:        Psychiatric    Major depression:         Hematologic    Bleeding problems:    Problems with blood clotting too easily:        Skin    Rashes or ulcers:        Constitutional    Fever or chills:      PHYSICAL EXAMINATION:  ***  General:  WDWN in NAD; vital signs documented above Gait: Not observed HENT: WNL, normocephalic Pulmonary: normal non-labored breathing , without wheezing Cardiac: {Desc; regular/irreg:14544} HR, {With/Without:20273} carotid bruit*** Abdomen: soft, NT; aortic pulse is *** palpable Skin: {With/Without:20273} rashes Vascular Exam/Pulses:  Right Left  Radial {Exam; arterial pulse strength 0-4:30167} {Exam; arterial pulse strength 0-4:30167}  Femoral {Exam; arterial pulse strength 0-4:30167} {Exam; arterial pulse strength 0-4:30167}  Popliteal {Exam; arterial pulse strength 0-4:30167} {Exam; arterial pulse strength 0-4:30167}  DP {Exam; arterial pulse strength 0-4:30167} {Exam; arterial pulse strength 0-4:30167}  PT {Exam; arterial pulse strength 0-4:30167} {Exam; arterial pulse strength 0-4:30167}  Peroneal *** ***   Extremities: {With/Without:20273} ischemic changes, {With/Without:20273} Gangrene , {With/Without:20273} cellulitis; {With/Without:20273} open wounds Musculoskeletal: no muscle wasting or atrophy  Neurologic: A&O X 3 Psychiatric:  The pt has {Desc; normal/abnormal:11317::"Normal"} affect.   Non-Invasive Vascular Imaging:   ABI's/TBI's on 03/16/2023: Right:  *** - Great toe pressure: *** Left:  *** - Great toe pressure: ***  Previous ABI's/TBI's on 09/15/2022: Right:  0.54/0.34 - Great toe pressure: 50 Left:  0.78/0.57 - Great toe pressure:  84    ASSESSMENT/PLAN:: 67 y.o. female here for follow up for  PAD with hx of BLE claudication here for follow up studies   -*** -continue asa/statin -pt will f/u in *** with ***.   Doreatha Massed, [facility] [phone]  Clinic MD:   Randie Heinz

## 2023-03-16 ENCOUNTER — Ambulatory Visit (HOSPITAL_COMMUNITY): Payer: 59

## 2023-03-16 ENCOUNTER — Ambulatory Visit: Payer: Medicare Other

## 2023-04-22 ENCOUNTER — Ambulatory Visit: Payer: 59 | Admitting: Podiatry

## 2023-05-17 ENCOUNTER — Other Ambulatory Visit: Payer: Self-pay | Admitting: Cardiology

## 2023-06-13 ENCOUNTER — Other Ambulatory Visit: Payer: Self-pay | Admitting: Cardiology

## 2023-07-27 ENCOUNTER — Ambulatory Visit (HOSPITAL_COMMUNITY)
Admission: RE | Admit: 2023-07-27 | Discharge: 2023-07-27 | Disposition: A | Discharge status: Home / Self Care | Payer: 59 | Source: Ambulatory Visit | Attending: Vascular Surgery | Admitting: Vascular Surgery

## 2023-07-27 ENCOUNTER — Ambulatory Visit (INDEPENDENT_AMBULATORY_CARE_PROVIDER_SITE_OTHER): Payer: 59 | Admitting: Physician Assistant

## 2023-07-27 ENCOUNTER — Encounter: Payer: Self-pay | Admitting: Physician Assistant

## 2023-07-27 VITALS — BP 123/68 | HR 65 | Temp 97.9°F | Resp 18 | Ht 62.0 in | Wt 122.0 lb

## 2023-07-27 DIAGNOSIS — I739 Peripheral vascular disease, unspecified: Secondary | ICD-10-CM

## 2023-07-27 DIAGNOSIS — F17200 Nicotine dependence, unspecified, uncomplicated: Secondary | ICD-10-CM

## 2023-07-27 LAB — VAS US ABI WITH/WO TBI
Left ABI: 0.64
Right ABI: 0.5

## 2023-07-27 NOTE — Progress Notes (Signed)
[facility] [place] HISTORY AND PHYSICAL   History of Present Illness:  68 y/o female with known PAD.  She states she continues to have claudication symptoms inconstantly and not daily.  She states she does not get out much especially in the cold weather.  She tends to lead a sedentary life style.  She continues to smoke daily.  Her right LE tends to bother more than the left.  She has not seen any change in her daily activities since her last visit.    She has been followed since 2022 by Dr. Randie Heinz.  She denies history of frank rest pain or non healing wounds.    The pt is on a statin for cholesterol management.  The pt is  on a daily aspirin.   Other AC:  none The pt is on BB for hypertension.   The pt is not diabetic. Tobacco hx:  current, 1/2 ppd   Past Medical History:  Diagnosis Date   Adenomatous colon polyp    Angina pectoris (HCC)    Anxiety    Atherosclerosis    Cigarette smoker one half pack a day or less    ~ 10 cigarrettes/day   Complication of anesthesia    Depression    GERD (gastroesophageal reflux disease)    Hyperlipidemia    Osteoarthritis    PONV (postoperative nausea and vomiting)    Prediabetes    PVD (peripheral vascular disease) (HCC)    Vulvar cancer (HCC) 2009   Radical vulvectomy and left side in July 2009.  Recurrence in 2010-reexcision June 2010.  Last follow-up in 2016.    Past Surgical History:  Procedure Laterality Date   ABDOMINAL HYSTERECTOMY  03/11/2006   For uterine myoma   COLONOSCOPY  08/03/2011   Procedure: COLONOSCOPY;  Surgeon: Graylin Shiver, MD;  Location: [facility];  Service: Endoscopy;  Laterality: N/A;   RADICAL VULVECTOMY  12/2007   Laser vulvectomy; repeat procedure in 2010    ROS:   General:  No weight loss, Fever, chills  HEENT: No recent headaches, no nasal bleeding, no visual changes, no sore throat  Neurologic: No dizziness, blackouts, seizures. No recent symptoms of stroke or mini- stroke. No  recent episodes of slurred speech, or temporary blindness.  Cardiac: No recent episodes of chest pain/pressure, no shortness of breath at rest.  No shortness of breath with exertion.  Denies history of atrial fibrillation or irregular heartbeat  Vascular: No history of rest pain in feet.  No history of claudication.  No history of non-healing ulcer, No history of DVT   Pulmonary: No home oxygen, no productive cough, no hemoptysis,  No asthma or wheezing  Musculoskeletal:  [ ]  Arthritis, [ ]  Low back pain,  [ ]  Joint pain  Hematologic:No history of hypercoagulable state.  No history of easy bleeding.  No history of anemia  Gastrointestinal: No hematochezia or melena,  No gastroesophageal reflux, no trouble swallowing  Urinary: [ ]  chronic Kidney disease, [ ]  on HD - [ ]  MWF or [ ]  TTHS, [ ]  Burning with urination, [ ]  Frequent urination, [ ]  Difficulty urinating;   Skin: No rashes  Psychological: No history of anxiety,  No history of depression  Social History Social History   Tobacco Use   Smoking status: Every Day    Current packs/day: 0.50    Average packs/day: 0.5 packs/day for 20.0 years (10.0 ttl pk-yrs)    Types: Cigarettes    Passive exposure: Never  Smokeless tobacco: Never  Vaping Use   Vaping status: Never Used  Substance Use Topics   Alcohol use: No   Drug use: Yes    Types: Marijuana    Comment: Maybe a couple times a month    Family History Family History  Problem Relation Age of Onset   Hypertension Mother    Stroke Mother 76   Heart disease Mother        She is not aware of any details.   Hypertension Sister    Hypertension Sister    Prostate cancer Brother    Hypertension Brother    Hypertension Brother    Hypertension Brother    Colon cancer Neg Hx    Esophageal cancer Neg Hx    Rectal cancer Neg Hx    Stomach cancer Neg Hx     Allergies  Allergies  Allergen Reactions   Codeine Nausea Only    REACTION: Nausea     Current Outpatient  Medications  Medication Sig Dispense Refill   aspirin 81 MG tablet Take 81 mg by mouth every morning.      atorvastatin (LIPITOR) 40 MG tablet TAKE 1 TABLET(40 MG) BY MOUTH DAILY 90 tablet 0   buPROPion (WELLBUTRIN XL) 150 MG 24 hr tablet Take 1 tablet (150 mg total) by mouth daily. 90 tablet 3   diclofenac sodium (VOLTAREN) 1 % GEL      metoprolol succinate (TOPROL-XL) 25 MG 24 hr tablet Take 1 tablet (25 mg total) by mouth daily. Please keep upcoming Jan appt for further refills. Thank you 90 tablet 0   Multiple Vitamins-Minerals (CENTRUM VITAMINTS PO) Take by mouth daily.     omeprazole (PRILOSEC) 20 MG capsule TAKE ONE CAPSULE BY MOUTH TWICE DAILY 60 capsule 2   nicotine polacrilex (NICOTINE MINI) 2 MG lozenge Take 1 lozenge (2 mg total) by mouth as needed for smoking cessation. (Patient not taking: Reported on 07/27/2023) 100 tablet 6   No current facility-administered medications for this visit.    Physical Examination  Vitals:   07/27/23 1436  BP: 123/68  Pulse: 65  Resp: 18  Temp: 97.9 F (36.6 C)  TempSrc: Temporal  SpO2: 95%  Weight: 122 lb (55.3 kg)  Height: 5\' 2"  (1.575 m)    Body mass index is 22.31 kg/m.  General:  Alert and oriented, no acute distress HEENT: Normal Neck: No bruit or JVD Pulmonary: Clear to auscultation bilaterally Cardiac: Regular Rate and Rhythm without murmur Abdomen: Soft, non-tender, non-distended, no mass, no scars Skin: No rash, no wounds on B feet Extremity Pulses:  2+ radial,femoral,  pulses bilaterally.  Non palpable pedal pulses Musculoskeletal: No deformity or edema  Neurologic: Upper and lower extremity motor 5/5 and symmetric  DATA:  ABI Findings:  +---------+------------------+-----+----------+--------+  Right   Rt Pressure (mmHg)IndexWaveform  Comment   +---------+------------------+-----+----------+--------+  Brachial 117                                         +---------+------------------+-----+----------+--------+  PTA     56                0.48 monophasic          +---------+------------------+-----+----------+--------+  DP      58                0.50 monophasic          +---------+------------------+-----+----------+--------+  Great Toe0                 0.00 Absent              +---------+------------------+-----+----------+--------+   +---------+------------------+-----+----------+-------+  Left    Lt Pressure (mmHg)IndexWaveform  Comment  +---------+------------------+-----+----------+-------+  Brachial 116                                       +---------+------------------+-----+----------+-------+  PTA     75                0.64 monophasic         +---------+------------------+-----+----------+-------+  DP      68                0.58 monophasic         +---------+------------------+-----+----------+-------+  Great Toe50                0.43 Abnormal           +---------+------------------+-----+----------+-------+   +-------+-----------+-----------+------------+------------+  ABI/TBIToday's ABIToday's TBIPrevious ABIPrevious TBI  +-------+-----------+-----------+------------+------------+  Right 0.50       NA         0.54        0.34          +-------+-----------+-----------+------------+------------+  Left  0.64       0.43       0.78        0.57          +-------+-----------+-----------+------------+------------+       Summary:  Right: Resting right ankle-brachial index indicates moderate right lower  extremity arterial disease. The right toe-brachial index is  abnormal/absent.   Left: Resting left ankle-brachial index indicates moderate left lower  extremity arterial disease. The left toe-brachial index is abnormal.   Right TBIs appear decreased/absent. Left ABIs appear decreased compared to  prior exam 09/15/2022   ASSESSMENT/PLAN:  PAD with history of  claudication and occasional rest type pain that does not occur nightly.  She continues to smoke daily and does not walk for exercise.   Her ABI's have decreased slightly and TBI as well.  She does not have increased pain and no evidence of non healing wounds.  I discussed with her she is at high risk needing surgical intervention if she develops a non healing wound and /or limb loss.  I encouraged her to stop smoking and start walking. She will return in 3-4 months for repeat ABI's.  If she develops a non heal;ing wound or frank nightly rest pain she will call our office.         Mosetta Pigeon PA-C [facility] [place] Office: [phone]  MD in clinic [place]

## 2023-08-11 ENCOUNTER — Other Ambulatory Visit: Payer: Self-pay

## 2023-08-11 DIAGNOSIS — I739 Peripheral vascular disease, unspecified: Secondary | ICD-10-CM

## 2023-09-05 ENCOUNTER — Ambulatory Visit (INDEPENDENT_AMBULATORY_CARE_PROVIDER_SITE_OTHER): Payer: 59 | Admitting: Podiatry

## 2023-09-05 ENCOUNTER — Encounter: Payer: Self-pay | Admitting: Podiatry

## 2023-09-05 DIAGNOSIS — L84 Corns and callosities: Secondary | ICD-10-CM

## 2023-09-05 DIAGNOSIS — M79675 Pain in left toe(s): Secondary | ICD-10-CM

## 2023-09-05 DIAGNOSIS — B351 Tinea unguium: Secondary | ICD-10-CM

## 2023-09-05 DIAGNOSIS — I739 Peripheral vascular disease, unspecified: Secondary | ICD-10-CM | POA: Diagnosis not present

## 2023-09-05 DIAGNOSIS — M79674 Pain in right toe(s): Secondary | ICD-10-CM | POA: Diagnosis not present

## 2023-09-05 NOTE — Progress Notes (Signed)
  Subjective:  Patient ID: [name], female    DOB: 1956/05/02,   MRN: [id number]  No chief complaint on file.   68 y.o. female presents for concern of thickened elongated and painful nails that are difficult to trim. Requesting to have them trimmed today. She has a history of PAD and claudications. At risk for foot care  PCP:  Hillery Aldo, NP    . Denies any other pedal complaints. Denies n/v/f/c.   Past Medical History:  Diagnosis Date   Adenomatous colon polyp    Angina pectoris (HCC)    Anxiety    Atherosclerosis    Cigarette smoker one half pack a day or less    ~ 10 cigarrettes/day   Complication of anesthesia    Depression    GERD (gastroesophageal reflux disease)    Hyperlipidemia    Osteoarthritis    PONV (postoperative nausea and vomiting)    Prediabetes    PVD (peripheral vascular disease) (HCC)    Vulvar cancer (HCC) 2009   Radical vulvectomy and left side in July 2009.  Recurrence in 2010-reexcision June 2010.  Last follow-up in 2016.    Objective:  Physical Exam Vascular: DP/PT pulses 1/4 bilateral. CFT <4 seconds. Absent  hair growth on digits. No edema.  Skin. No lacerations or abrasions bilateral feet. Nails 1-5 bilateral are thickened elongated and dystrophic with subungual debris. Hyperkeratotic cored lesion sub second left metatarsal and left heel xx 2.  Musculoskeletal: MMT 5/5 bilateral lower extremities in DF, PF, Inversion and Eversion. Deceased ROM in DF of ankle joint.  Neurological: Sensation intact to light touch.   Assessment:   1. Dermatophytosis of nail   2. Pain in toes of both feet   3. PAD (peripheral artery disease) (HCC)      Plan:  Patient was evaluated and treated and all questions answered. -Discussed and educated patient on foot care, especially with  regards to the vascular, neurological and musculoskeletal systems.  -Discussed supportive shoes at all times and checking feet regularly.  -Mechanically debrided all nails  1-5 bilateral using sterile nail nipper and filed with dremel without incident  -Hyperkeratotic lesion debrided to plantar sub second metatarsal of left foot and heel of left foot x 2  without incident with chisel.  -Answered all patient questions -Patient to return  in 3 months for at risk foot care -Patient advised to call the office if any problems or questions arise in the meantime.   Louann Sjogren, DPM

## 2023-12-07 ENCOUNTER — Ambulatory Visit (INDEPENDENT_AMBULATORY_CARE_PROVIDER_SITE_OTHER): Admitting: Podiatry

## 2023-12-07 DIAGNOSIS — Z91199 Patient's noncompliance with other medical treatment and regimen due to unspecified reason: Secondary | ICD-10-CM

## 2023-12-07 NOTE — Progress Notes (Signed)
 Cancel 24 hours

## 2024-02-01 ENCOUNTER — Ambulatory Visit: Payer: 59

## 2024-02-01 ENCOUNTER — Ambulatory Visit (HOSPITAL_COMMUNITY): Payer: 59
